# Patient Record
Sex: Female | Born: 1980 | Race: White | Hispanic: No | Marital: Married | Smoking: Former smoker
Health system: Southern US, Community
[De-identification: ages and names within clinical notes are randomized; demographics above are authoritative.]

## PROBLEM LIST (undated history)

## (undated) DIAGNOSIS — K589 Irritable bowel syndrome without diarrhea: Secondary | ICD-10-CM

## (undated) DIAGNOSIS — F419 Anxiety disorder, unspecified: Secondary | ICD-10-CM

## (undated) DIAGNOSIS — T7840XA Allergy, unspecified, initial encounter: Secondary | ICD-10-CM

## (undated) DIAGNOSIS — M25569 Pain in unspecified knee: Secondary | ICD-10-CM

## (undated) DIAGNOSIS — S83509A Sprain of unspecified cruciate ligament of unspecified knee, initial encounter: Secondary | ICD-10-CM

## (undated) DIAGNOSIS — E785 Hyperlipidemia, unspecified: Secondary | ICD-10-CM

## (undated) DIAGNOSIS — M545 Low back pain: Secondary | ICD-10-CM

## (undated) HISTORY — PX: DILATION AND CURETTAGE OF UTERUS: SHX78

## (undated) HISTORY — PX: WISDOM TOOTH EXTRACTION: SHX21

## (undated) HISTORY — DX: Allergy, unspecified, initial encounter: T78.40XA

## (undated) HISTORY — DX: Low back pain: M54.5

## (undated) HISTORY — DX: Sprain of unspecified cruciate ligament of unspecified knee, initial encounter: S83.509A

## (undated) HISTORY — DX: Pain in unspecified knee: M25.569

## (undated) HISTORY — DX: Hyperlipidemia, unspecified: E78.5

## (undated) HISTORY — DX: Anxiety disorder, unspecified: F41.9

## (undated) HISTORY — DX: Irritable bowel syndrome, unspecified: K58.9

---
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
## 1999-04-09 ENCOUNTER — Other Ambulatory Visit: Admission: RE | Admit: 1999-04-09 | Discharge: 1999-04-09 | Payer: Self-pay | Admitting: Obstetrics and Gynecology

## 1999-08-15 ENCOUNTER — Other Ambulatory Visit: Admission: RE | Admit: 1999-08-15 | Discharge: 1999-08-15 | Payer: Self-pay | Admitting: Obstetrics and Gynecology

## 1999-12-17 ENCOUNTER — Other Ambulatory Visit: Admission: RE | Admit: 1999-12-17 | Discharge: 1999-12-17 | Payer: Self-pay | Admitting: *Deleted

## 2000-07-01 ENCOUNTER — Other Ambulatory Visit: Admission: RE | Admit: 2000-07-01 | Discharge: 2000-07-01 | Payer: Self-pay | Admitting: *Deleted

## 2001-07-13 ENCOUNTER — Other Ambulatory Visit: Admission: RE | Admit: 2001-07-13 | Discharge: 2001-07-13 | Payer: Self-pay | Admitting: Obstetrics and Gynecology

## 2002-07-07 ENCOUNTER — Other Ambulatory Visit: Admission: RE | Admit: 2002-07-07 | Discharge: 2002-07-07 | Payer: Self-pay | Admitting: Obstetrics and Gynecology

## 2002-12-07 ENCOUNTER — Other Ambulatory Visit: Admission: RE | Admit: 2002-12-07 | Discharge: 2002-12-07 | Payer: Self-pay | Admitting: Obstetrics and Gynecology

## 2003-05-30 ENCOUNTER — Other Ambulatory Visit: Admission: RE | Admit: 2003-05-30 | Discharge: 2003-05-30 | Payer: Self-pay | Admitting: Obstetrics and Gynecology

## 2004-01-05 ENCOUNTER — Other Ambulatory Visit: Admission: RE | Admit: 2004-01-05 | Discharge: 2004-01-05 | Payer: Self-pay | Admitting: Obstetrics and Gynecology

## 2004-06-22 ENCOUNTER — Observation Stay (HOSPITAL_COMMUNITY): Admission: AC | Admit: 2004-06-22 | Discharge: 2004-06-23 | Payer: Self-pay

## 2005-09-16 ENCOUNTER — Ambulatory Visit: Payer: Self-pay | Admitting: Internal Medicine

## 2005-09-17 ENCOUNTER — Ambulatory Visit: Payer: Self-pay | Admitting: Internal Medicine

## 2005-09-30 ENCOUNTER — Ambulatory Visit: Payer: Self-pay | Admitting: Internal Medicine

## 2005-10-03 ENCOUNTER — Ambulatory Visit: Payer: Self-pay | Admitting: Internal Medicine

## 2005-10-06 ENCOUNTER — Ambulatory Visit: Payer: Self-pay | Admitting: Internal Medicine

## 2005-10-10 ENCOUNTER — Ambulatory Visit: Payer: Self-pay | Admitting: Internal Medicine

## 2005-10-13 ENCOUNTER — Ambulatory Visit: Payer: Self-pay | Admitting: Internal Medicine

## 2005-10-20 ENCOUNTER — Ambulatory Visit: Payer: Self-pay | Admitting: Internal Medicine

## 2005-10-29 ENCOUNTER — Ambulatory Visit: Payer: Self-pay | Admitting: Internal Medicine

## 2005-11-03 ENCOUNTER — Ambulatory Visit: Payer: Self-pay | Admitting: Internal Medicine

## 2005-11-07 ENCOUNTER — Ambulatory Visit: Payer: Self-pay | Admitting: Internal Medicine

## 2005-11-10 ENCOUNTER — Ambulatory Visit: Payer: Self-pay | Admitting: Internal Medicine

## 2005-11-14 ENCOUNTER — Ambulatory Visit: Payer: Self-pay | Admitting: Internal Medicine

## 2005-11-17 ENCOUNTER — Ambulatory Visit: Payer: Self-pay | Admitting: Internal Medicine

## 2005-11-21 ENCOUNTER — Ambulatory Visit: Payer: Self-pay | Admitting: Internal Medicine

## 2005-11-24 ENCOUNTER — Ambulatory Visit: Payer: Self-pay | Admitting: Internal Medicine

## 2005-11-28 ENCOUNTER — Ambulatory Visit: Payer: Self-pay | Admitting: Internal Medicine

## 2005-12-01 ENCOUNTER — Ambulatory Visit: Payer: Self-pay | Admitting: Internal Medicine

## 2005-12-02 ENCOUNTER — Ambulatory Visit: Payer: Self-pay | Admitting: Internal Medicine

## 2005-12-05 ENCOUNTER — Ambulatory Visit: Payer: Self-pay | Admitting: Internal Medicine

## 2005-12-08 ENCOUNTER — Ambulatory Visit: Payer: Self-pay | Admitting: Internal Medicine

## 2005-12-12 ENCOUNTER — Ambulatory Visit: Payer: Self-pay | Admitting: Internal Medicine

## 2005-12-15 ENCOUNTER — Ambulatory Visit: Payer: Self-pay | Admitting: Internal Medicine

## 2005-12-18 ENCOUNTER — Ambulatory Visit: Payer: Self-pay | Admitting: Internal Medicine

## 2005-12-25 ENCOUNTER — Ambulatory Visit: Payer: Self-pay | Admitting: Internal Medicine

## 2005-12-31 ENCOUNTER — Ambulatory Visit: Payer: Self-pay | Admitting: Internal Medicine

## 2006-01-02 ENCOUNTER — Ambulatory Visit: Payer: Self-pay | Admitting: Internal Medicine

## 2006-01-07 ENCOUNTER — Ambulatory Visit: Payer: Self-pay | Admitting: Internal Medicine

## 2006-01-12 ENCOUNTER — Ambulatory Visit: Payer: Self-pay | Admitting: Internal Medicine

## 2006-01-16 ENCOUNTER — Ambulatory Visit: Payer: Self-pay | Admitting: Internal Medicine

## 2006-01-19 ENCOUNTER — Ambulatory Visit: Payer: Self-pay | Admitting: Internal Medicine

## 2006-01-23 ENCOUNTER — Ambulatory Visit: Payer: Self-pay | Admitting: Internal Medicine

## 2006-01-26 ENCOUNTER — Ambulatory Visit: Payer: Self-pay | Admitting: Internal Medicine

## 2006-02-02 ENCOUNTER — Ambulatory Visit: Payer: Self-pay | Admitting: Internal Medicine

## 2006-02-10 ENCOUNTER — Ambulatory Visit: Payer: Self-pay | Admitting: Internal Medicine

## 2006-02-13 ENCOUNTER — Ambulatory Visit: Payer: Self-pay | Admitting: Internal Medicine

## 2009-07-25 ENCOUNTER — Ambulatory Visit: Payer: Self-pay | Admitting: Sports Medicine

## 2009-07-25 DIAGNOSIS — M25569 Pain in unspecified knee: Secondary | ICD-10-CM

## 2009-07-25 DIAGNOSIS — S83509A Sprain of unspecified cruciate ligament of unspecified knee, initial encounter: Secondary | ICD-10-CM

## 2009-07-25 HISTORY — DX: Sprain of unspecified cruciate ligament of unspecified knee, initial encounter: S83.509A

## 2009-07-25 HISTORY — DX: Pain in unspecified knee: M25.569

## 2011-01-31 NOTE — Discharge Summary (Signed)
NAMEFLORIDE, HUTMACHER             ACCOUNT NO.:  [id number]   MEDICAL RECORD NO.:  [id number]          PATIENT TYPE:  INP   LOCATION:  3020                         FACILITY:  [facility]   PHYSICIAN:  Jimmye Norman, M.D.      DATE OF BIRTH:  Mar 22, 1981   DATE OF ADMISSION:  06/22/2004  DATE OF DISCHARGE:  06/23/2004                                 DISCHARGE SUMMARY   CONSULTATIONS:  None.   DISCHARGE DIAGNOSES:  1.  Status post fall from a horse.  2.  Mild concussion.  3.  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  4.  Lumbar strain.  5.  Cervical neck strain.   HISTORY:  This is a healthy 30 year old female who was riding a horse when  the horse jolted and she fell off, landing on the back of her head and  hurled back onto a concrete floor.  She was initially alert but then lost  consciousness and had a post traumatic seizure, reportedly lasting  approximately 45 seconds per bystanders.  She was brought in by EMS awake,  alert and appropriately conversing.  She was complaining of headache and low  back pain.   Work-up at this time including CT scan of the head was negative for acute  intracranial abnormality.  Neck CT scan and cervical spine was without acute  abnormality.  No fractures.  Lumbosacral spine also was negative for  fracture.   The patient was admitted for observation for concussion and post traumatic  seizure.  She had no further seizure activity.  She continues to have  difficulty with headache and neck and low back soreness, but otherwise has  done well.  She is eating.  She is ambulatory.  The patient is deemed ready  for discharge at this time.   DISCHARGE MEDICATIONS:  1.  Vicodin one to two p.o. q.4-6h. p.r.n. pain, #40, no refills.  2.  Flexeril 10 mg one p.o. t.i.d. p.r.n. muscle spasm.  3.  Tylenol or ibuprofen products as needed for milder pain.   ACTIVITY:  To tolerance.  She is to not work or resume driving until cleared  by trauma  service.   FOLLOW UP:  She was cautioned to return for intractable headache, any  recurrent seizure activity or intractable nausea and vomiting.  Follow up in  the trauma clinic on July 02, 2004 or sooner should she have difficulty  in the interim.     SR/MEDQ  D:  06/23/2004  T:  06/23/2004  Job:  [id number]

## 2011-06-03 ENCOUNTER — Ambulatory Visit (INDEPENDENT_AMBULATORY_CARE_PROVIDER_SITE_OTHER): Payer: BC Managed Care – PPO | Admitting: Sports Medicine

## 2011-06-03 ENCOUNTER — Encounter: Payer: Self-pay | Admitting: Sports Medicine

## 2011-06-03 VITALS — BP 119/83 | HR 73 | Ht 63.0 in | Wt 140.0 lb

## 2011-06-03 DIAGNOSIS — M545 Low back pain, unspecified: Secondary | ICD-10-CM

## 2011-06-03 HISTORY — DX: Low back pain, unspecified: M54.50

## 2011-06-03 NOTE — Patient Instructions (Signed)
Stretching Standing against the wall     Knee to chest     Knee to opposite shoulder  Exercises See attached sheet  Continue exercises  Follow up in a few months if not improving

## 2011-06-03 NOTE — Progress Notes (Signed)
  Subjective:    Patient ID: Regina Fernandez, female    DOB: 1981-07-17, 30 y.o.   MRN: [id number]  HPI 30 yo female presents for low back pain/buttock pain. 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 Every AM she has difficulty standing on the right leg alone and has back stiffness in the AM. Rides horses; has pain when bending over to take care of horses feet. There has not been a recent fall off the horse or history of back injury.  She tried Advil for 2-3 weeks back in August which did not help. 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 Patient with increased lumbar lordosis Core stability exercises demonstrated at the office visit and handout given. Piriformis stretches also demonstrated. Supportive footwear while working as well as stretching during breaks

## 2012-01-09 ENCOUNTER — Ambulatory Visit (INDEPENDENT_AMBULATORY_CARE_PROVIDER_SITE_OTHER): Payer: BC Managed Care – PPO | Admitting: Family Medicine

## 2012-01-09 ENCOUNTER — Ambulatory Visit (HOSPITAL_BASED_OUTPATIENT_CLINIC_OR_DEPARTMENT_OTHER)
Admission: RE | Admit: 2012-01-09 | Discharge: 2012-01-09 | Disposition: A | Discharge status: Home / Self Care | Payer: BC Managed Care – PPO | Source: Ambulatory Visit | Attending: Family Medicine | Admitting: Family Medicine

## 2012-01-09 ENCOUNTER — Encounter: Payer: Self-pay | Admitting: Family Medicine

## 2012-01-09 VITALS — BP 129/89 | HR 84 | Temp 98.0°F | Ht 63.0 in | Wt 137.0 lb

## 2012-01-09 DIAGNOSIS — I998 Other disorder of circulatory system: Secondary | ICD-10-CM | POA: Insufficient documentation

## 2012-01-09 DIAGNOSIS — M545 Low back pain: Secondary | ICD-10-CM

## 2012-01-09 DIAGNOSIS — M533 Sacrococcygeal disorders, not elsewhere classified: Secondary | ICD-10-CM

## 2012-01-09 NOTE — Patient Instructions (Signed)
The location of your pain, exam, and history suggests a sacral contusion from repetitive compression on this area while horseback riding. Altering the way you sit on the horse is likely the best way to eliminate this (and/or avoiding it altogether though this is not our goal here). Get the x-rays of your lumbosacral region - we will call you with the results though this is likely to be normal. Ice or heat (whichever feels better) for 15 minutes at a time 3-4 times a day. Donut pad is a consideration if pain at tailbone. Tylenol and/or aleve as needed for pain. Consider formal physical therapy though I'm not sure it will help you much with this condition.

## 2012-01-12 ENCOUNTER — Encounter: Payer: Self-pay | Admitting: Family Medicine

## 2012-01-12 NOTE — Assessment & Plan Note (Signed)
Pain currently seems different than prior presentation to other sports medicine office.  This is midline in sacral/coccygeal area.  X-rays done today negative.  Believe this is due to repetitive bony compression leading to contusion.  Discussed ideal way to help with this would be to sit upright when riding, provide more cushion between her and the horse and possibly a donut type pad.  Ice, tylenol/aleve as needed.  F/u prn.  Do not think PT would help much with this.

## 2012-01-12 NOTE — Progress Notes (Signed)
  Subjective:    Patient ID: Regina Fernandez, female    DOB: 15-Jul-1981, 31 y.o.   MRN: [id number]  PCP: Dr. Juliene Pina  HPI 31 yo F here for low back pain  Patient denies acute injury. She reports having had problems with back in the past - dx with SI dysfunction and accentuated lordosis. States she only intermittently rides horses now - did so 3 times 2 weeks ago and since then has had midline sacral/coccygeal pain without acute injury. No swelling, bruising. No radiation of pain (remotely had problems with pain into thighs). Has tried heating pad, yoga, rest. Not tried any medications. No bowel/bladder dysfunction.  History reviewed. No pertinent past medical history.  No current outpatient prescriptions on file prior to visit.    History reviewed. No pertinent past surgical history.  Allergies  Allergen Reactions  . Miconazole     History   Social History  . Marital Status: Married    Spouse Name: N/A    Number of Children: N/A  . Years of Education: N/A   Occupational History  . Not on file.   Social History Main Topics  . Smoking status: Never Smoker   . Smokeless tobacco: Never Used  . Alcohol Use: Not on file  . Drug Use: Not on file  . Sexually Active: Not on file   Other Topics Concern  . Not on file   Social History Narrative  . No narrative on file    Family History  Problem Relation Age of Onset  . Hyperlipidemia Father   . Sudden death Neg Hx   . Hypertension Neg Hx   . Heart attack Neg Hx   . Diabetes Neg Hx     BP 129/89  Pulse 84  Temp(Src) 98 F (36.7 C) (Oral)  Ht 5\' 3"  (1.6 m)  Wt 137 lb (62.143 kg)  BMI 24.27 kg/m2  LMP 12/26/2011  Review of Systems See HPI above.    Objective:   Physical Exam Gen: NAD  Back: No gross deformity, scoliosis. No focal TTP.  No midline or bony TTP.= - feels deep in midline sacral area per patient. FROM without pain. Strength LEs 5/5 all muscle groups.   2+ MSRs in patellar and achilles  tendons, equal bilaterally. Negative SLRs. Sensation intact to light touch bilaterally. Negative logroll bilateral hips Negative fabers and piriformis stretches.    Assessment & Plan:  1. Low back pain - Pain currently seems different than prior presentation to other sports medicine office.  This is midline in sacral/coccygeal area.  X-rays done today negative.  Believe this is due to repetitive bony compression leading to contusion.  Discussed ideal way to help with this would be to sit upright when riding, provide more cushion between her and the horse and possibly a donut type pad.  Ice, tylenol/aleve as needed.  F/u prn.  Do not think PT would help much with this.

## 2012-01-21 ENCOUNTER — Ambulatory Visit: Payer: BC Managed Care – PPO | Admitting: Sports Medicine

## 2014-05-31 ENCOUNTER — Ambulatory Visit (INDEPENDENT_AMBULATORY_CARE_PROVIDER_SITE_OTHER): Payer: PRIVATE HEALTH INSURANCE | Admitting: Family Medicine

## 2014-05-31 VITALS — BP 120/70 | HR 73 | Temp 98.2°F | Resp 16 | Ht 63.0 in | Wt 121.0 lb

## 2014-05-31 DIAGNOSIS — R599 Enlarged lymph nodes, unspecified: Secondary | ICD-10-CM

## 2014-05-31 NOTE — Progress Notes (Signed)
  Regina Fernandez - 33 y.o. female MRN [id number]  Date of birth: 1980/09/28  SUBJECTIVE:  Including CC & ROS.  patient C/O: left side neck nodule Onset of symptoms: 3 days Symptoms:  Pea size nodule at the base of her skull on the left-side, causing soreness. Has not increased in size, no redness, swelling, or drainage. Reports recent local bug bite in the upper back that cause swelling. She has cats but no scratches, has tattoos but non-recently Relieving factors: nothing topical but tried ibuprofen for pain Worsened by: palpation    ROS:  Constitutional:  No fever, chills, or fatigue.  Respiratory:  No shortness of breath, cough, or wheezing Cardiovascular:  No palpitations, chest pain or syncope Gastrointestinal:  No nausea, no abdominal pain Review of systems otherwise negative except for what is stated in HPI  HISTORY: Past Medical, Surgical, Social, and Family History Reviewed & Updated per EMR. Pertinent Historical Findings include: Nonsmoker, no exposure to cat bite or scratch   PHYSICAL EXAM:  VS: BP:120/70 mmHg  HR:73bpm  TEMP:98.2 F (36.8 C)(Oral)  RESP:100 %  HT:5\' 3"  (160 cm)   WT:121 lb (54.885 kg)  BMI:21.5 UPPER BACK EXAM: General: well nourished Skin of UE: warm; dry, no rashes,no ecchymosis or erythema. 0.5cm moderately hard movable swell circumscribed swollen posterior lymph-node.  Vascular: radial pulses 2+ bilaterally Palpation: No step off defects throughout the cervical spine.  No significant paraspinal muscle tenderness. Range of motion: Normal shoulder range of motion.  Normal range of motion in flexion, extension, rotation of the neck. Motor and sensory intact  ASSESSMENT & PLAN:  Acute single swollen posterior cervical lymph node Counsel patient and re-assured her this is likely an localized acute reactive lymphadenopathy from her recent bug bits. Recommend watchful waiting for 2 weeks, and given precaution for concerning signs to f/u sooner.

## 2014-06-02 NOTE — Progress Notes (Signed)
Patient discussed and examined with Dr. Ollen Barges. Agree with assessment and plan of care per her note.

## 2014-10-11 ENCOUNTER — Ambulatory Visit (INDEPENDENT_AMBULATORY_CARE_PROVIDER_SITE_OTHER): Payer: PRIVATE HEALTH INSURANCE

## 2014-10-11 ENCOUNTER — Ambulatory Visit (INDEPENDENT_AMBULATORY_CARE_PROVIDER_SITE_OTHER): Payer: PRIVATE HEALTH INSURANCE | Admitting: Emergency Medicine

## 2014-10-11 VITALS — BP 106/62 | HR 92 | Temp 99.2°F | Resp 16 | Ht 64.5 in | Wt 131.0 lb

## 2014-10-11 DIAGNOSIS — R590 Localized enlarged lymph nodes: Secondary | ICD-10-CM

## 2014-10-11 DIAGNOSIS — D72829 Elevated white blood cell count, unspecified: Secondary | ICD-10-CM

## 2014-10-11 LAB — POCT CBC
Granulocyte percent: 82.8 %G — AB (ref 37–80)
HCT, POC: 40.3 % (ref 37.7–47.9)
Hemoglobin: 13 g/dL (ref 12.2–16.2)
Lymph, poc: 2.5 (ref 0.6–3.4)
MCH, POC: 29.2 pg (ref 27–31.2)
MCHC: 32.3 g/dL (ref 31.8–35.4)
MCV: 90.6 fL (ref 80–97)
MID (cbc): 0.7 (ref 0–0.9)
MPV: 7 fL (ref 0–99.8)
POC Granulocyte: 15.5 — AB (ref 2–6.9)
POC LYMPH PERCENT: 13.3 %L (ref 10–50)
POC MID %: 3.9 %M (ref 0–12)
Platelet Count, POC: 336 10*3/uL (ref 142–424)
RBC: 4.45 M/uL (ref 4.04–5.48)
RDW, POC: 14.5 %
WBC: 18.7 10*3/uL — AB (ref 4.6–10.2)

## 2014-10-11 LAB — COMPREHENSIVE METABOLIC PANEL
ALT: 14 U/L (ref 0–35)
AST: 16 U/L (ref 0–37)
Albumin: 3.8 g/dL (ref 3.5–5.2)
Alkaline Phosphatase: 54 U/L (ref 39–117)
BUN: 12 mg/dL (ref 6–23)
CO2: 26 mEq/L (ref 19–32)
Calcium: 8.8 mg/dL (ref 8.4–10.5)
Chloride: 106 mEq/L (ref 96–112)
Creat: 0.69 mg/dL (ref 0.50–1.10)
Glucose, Bld: 80 mg/dL (ref 70–99)
Potassium: 3.9 mEq/L (ref 3.5–5.3)
Sodium: 140 mEq/L (ref 135–145)
Total Bilirubin: 0.5 mg/dL (ref 0.2–1.2)
Total Protein: 6.5 g/dL (ref 6.0–8.3)

## 2014-10-11 LAB — POCT URINE PREGNANCY: Preg Test, Ur: NEGATIVE

## 2014-10-11 LAB — POCT RAPID STREP A (OFFICE): Rapid Strep A Screen: NEGATIVE

## 2014-10-11 MED ORDER — AMOXICILLIN-POT CLAVULANATE 875-125 MG PO TABS: 1.0000 | ORAL_TABLET | Freq: Two times a day (BID) | ORAL | Status: AC

## 2014-10-11 NOTE — Progress Notes (Signed)
Subjective:    Patient ID: Regina Fernandez, female    DOB: August 27, 1981, 34 y.o.   MRN: [id number]   PCP: Elveria Royals, MD  Chief Complaint  Patient presents with  . Adenopathy    x 2 days     Allergies  Allergen Reactions  . Miconazole     There are no active problems to display for this patient.   Prior to Admission medications   Medication Sig Start Date End Date Taking? Authorizing Provider  Drospiren-Eth Estrad-Levomefol (BEYAZ PO) Take by mouth daily.   Yes Historical Provider, MD  5-Hydroxytryptophan (5-HTP) 50 MG CAPS Take by mouth.    Historical Provider, MD  calcium elemental as carbonate (BARIATRIC TUMS ULTRA) 400 MG tablet Chew 1,000 mg by mouth.    Historical Provider, MD  Cholecalciferol (VITAMIN D3) 2000 UNITS capsule Take by mouth.    Historical Provider, MD  Melatonin 5 MG TABS Take by mouth.    Historical Provider, MD  Multiple Vitamin (THERA) TABS Take 1 tablet by mouth.    Historical Provider, MD    Medical, Surgical, Family and Social History reviewed and updated.  HPI  Presents accompanied by her boyfriend, for evaluation of swelling of nodes in her neck.  Awoke with swelling under the jaw yesterday morning. Has worsened-become more swollen. They note that the angle of her jaw has "disappeared." Advil helps pain and reduces swelling some. Now has a swollen feeling in the throat, not sore. Unable to fully open her mouth because it feels stiff.  This morning had trouble eating due to pain and pressure and swelling. Sitting up feels better than lying down. No worsening of the swelling with eating, salivating. Has not ever had this before. She did have a reactive lymph node last fall after an insect bite.  "It's Mono or Mumps or Strep."  Low grade fever last night. Teeth start to hurt when Advil wears off. Tonsils looked swollen and kind of junky this morning. No nausea/vomiting, diarrhea. No unexplained muscle or joint pain. Doesn't feel good, but  non-specific.  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 Seen at [facility] yesterday and was advised to suck on lemon drops.  Review of Systems As above.    Objective:   Physical Exam  Constitutional: She is oriented to person, place, and time. Vital signs are normal. She appears well-developed and well-nourished. She is active and cooperative. No distress.  BP 106/62 mmHg  Pulse 92  Temp(Src) 99.2 F (37.3 C) (Oral)  Resp 16  Ht 5' 4.5" (1.638 m)  Wt 131 lb (59.421 kg)  BMI 22.15 kg/m2  SpO2 100%  LMP 09/20/2014 (Exact Date)  HENT:  Head: Normocephalic and atraumatic.  Right Ear: Hearing, tympanic membrane, external ear and ear canal normal.  Left Ear: Hearing, tympanic membrane, external ear and ear canal normal.  Nose: Nose normal.  Mouth/Throat: Uvula is midline and oropharynx is clear and moist. No oropharyngeal exudate.  She has difficulty opening the mouth wide, reporting that it feels "stiff." Salivary duct openings are not irritated, erythematous or swollen, and are non-tender.  Eyes: Conjunctivae and EOM are normal. Pupils are equal, round, and reactive to light. Right eye exhibits no discharge. Left eye exhibits no discharge. No scleral icterus.  Neck: Trachea normal and normal range of motion. Neck supple. No spinous process tenderness and no muscular tenderness present. No thyromegaly present.  Cardiovascular: Normal rate, regular rhythm, normal  heart sounds and normal pulses.   Pulses:      Radial pulses are 2+ on the right side, and 2+ on the left side.  Pulmonary/Chest: Effort normal and breath sounds normal.  Lymphadenopathy:       Head (right side): Submandibular adenopathy present. No submental, no tonsillar, no preauricular, no posterior auricular and no occipital adenopathy present.       Head (left side): Submandibular adenopathy present. No submental, no tonsillar, no preauricular, no  posterior auricular and no occipital adenopathy present.    She has cervical adenopathy.       Right cervical: Posterior cervical (1 very small node, mid-chain ) adenopathy present. No superficial cervical and no deep cervical adenopathy present.      Left cervical: No superficial cervical, no deep cervical and no posterior cervical adenopathy present.    She has no axillary adenopathy.       Right: No supraclavicular and no epitrochlear adenopathy present.       Left: No supraclavicular and no epitrochlear adenopathy present.  Submandibular nodes are very firm and very symmetric. Tender.  Neurological: She is alert and oriented to person, place, and time. No sensory deficit.  Skin: Skin is warm, dry and intact. No rash noted. No cyanosis or erythema. Nails show no clubbing.  Psychiatric: She has a normal mood and affect. Her speech is normal and behavior is normal.     Results for orders placed or performed in visit on 10/11/14  POCT CBC  Result Value Ref Range   WBC 18.7 (A) 4.6 - 10.2 K/uL   Lymph, poc 2.5 0.6 - 3.4   POC LYMPH PERCENT 13.3 10 - 50 %L   MID (cbc) 0.7 0 - 0.9   POC MID % 3.9 0 - 12 %M   POC Granulocyte 15.5 (A) 2 - 6.9   Granulocyte percent 82.8 (A) 37 - 80 %G   RBC 4.45 4.04 - 5.48 M/uL   Hemoglobin 13.0 12.2 - 16.2 g/dL   HCT, POC 40.3 37.7 - 47.9 %   MCV 90.6 80 - 97 fL   MCH, POC 29.2 27 - 31.2 pg   MCHC 32.3 31.8 - 35.4 g/dL   RDW, POC 14.5 %   Platelet Count, POC 336 142 - 424 K/uL   MPV 7.0 0 - 99.8 fL  POCT rapid strep A  Result Value Ref Range   Rapid Strep A Screen Negative Negative  POCT urine pregnancy  Result Value Ref Range   Preg Test, Ur Negative    CXR: [facility] reading (PRIMARY) by  Dr. Everlene Farrier. No lymphadenopathy of the chest. Increased markings bilaterally are non-specific and may represent breast tissue attenuation.       Assessment & Plan:  1. Lymphadenopathy, submandibular Await remaining labs. Cover for bacterial process with  Augmentin. RTC in 48 hours for re-evaluation and repeat CBC, sooner if symptoms worsen. Supportive care in the meantime. - POCT CBC - Pathologist smear review - POCT rapid strep A - Comprehensive metabolic panel - POCT urine pregnancy - DG Chest 2 View; Future - Culture, Group A Strep - amoxicillin-clavulanate (AUGMENTIN) 875-125 MG per tablet; Take 1 tablet by mouth 2 (two) times daily.  Dispense: 20 tablet; Refill: 0  2. Leucocytosis See above. Doubt mono. CXR is reassuring.  - Pathologist smear review   Fara Chute, PA-C Physician Assistant-Certified [facility]

## 2014-10-11 NOTE — Patient Instructions (Signed)
Continue the Advil. You may take up to 800 mg three times daily with food. You may supplement that with acetaminophen (Tylenol) if needed. DRINK lots of fluids. Eat what tastes good.

## 2014-10-12 LAB — PATHOLOGIST SMEAR REVIEW

## 2014-10-13 ENCOUNTER — Ambulatory Visit (INDEPENDENT_AMBULATORY_CARE_PROVIDER_SITE_OTHER): Payer: PRIVATE HEALTH INSURANCE | Admitting: Emergency Medicine

## 2014-10-13 VITALS — BP 122/82 | HR 77 | Temp 98.5°F | Resp 16 | Ht 64.0 in | Wt 131.0 lb

## 2014-10-13 DIAGNOSIS — R682 Dry mouth, unspecified: Secondary | ICD-10-CM

## 2014-10-13 DIAGNOSIS — R591 Generalized enlarged lymph nodes: Secondary | ICD-10-CM

## 2014-10-13 DIAGNOSIS — R51 Headache: Secondary | ICD-10-CM

## 2014-10-13 DIAGNOSIS — R519 Headache, unspecified: Secondary | ICD-10-CM

## 2014-10-13 LAB — POCT CBC
Granulocyte percent: 75.4 %G (ref 37–80)
HCT, POC: 38.8 % (ref 37.7–47.9)
Hemoglobin: 12.5 g/dL (ref 12.2–16.2)
Lymph, poc: 2.4 (ref 0.6–3.4)
MCH, POC: 28.7 pg (ref 27–31.2)
MCHC: 32.3 g/dL (ref 31.8–35.4)
MCV: 89 fL (ref 80–97)
MID (cbc): 0.6 (ref 0–0.9)
MPV: 7.2 fL (ref 0–99.8)
POC Granulocyte: 9.3 — AB (ref 2–6.9)
POC LYMPH PERCENT: 19.9 %L (ref 10–50)
POC MID %: 4.7 %M (ref 0–12)
Platelet Count, POC: 363 10*3/uL (ref 142–424)
RBC: 4.36 M/uL (ref 4.04–5.48)
RDW, POC: 13.3 %
WBC: 12.3 10*3/uL — AB (ref 4.6–10.2)

## 2014-10-13 LAB — CULTURE, GROUP A STREP: Organism ID, Bacteria: NORMAL

## 2014-10-13 LAB — POCT SEDIMENTATION RATE: POCT SED RATE: 50 mm/hr — AB (ref 0–22)

## 2014-10-13 NOTE — Progress Notes (Addendum)
Subjective:   This chart was scribed for Regina Queen MD, by Tamsen Roers, at [facility].  This patient was seen in room 3 and the patient's care was started at 1:13 PM.      Patient ID: Regina Fernandez, female    DOB: [birth year], 34 y.o.   MRN: [id number]  HPI  HPI Comments: Regina Fernandez is a 34 y.o. female who presents to [facility] for a follow-up.   Patient notes that everyday is different.  States her salivary glands are swollen and her mouth has been very dry. Complains of puffiness in her face.  Patient is alternating Tylenol and Advil every 4 hours and states that if she doesn't, she is hurting and has trouble chewing.  Patient is a Marine scientist.      There are no active problems to display for this patient.  Past Medical History  Diagnosis Date  . Allergy   . KNEE PAIN, LEFT 07/25/2009    Qualifier: Diagnosis of  By: Oneida Alar MD, KARL    . Low back pain 06/03/2011  . SPRAIN AND STRAIN OF CRUCIATE LIGAMENT OF KNEE 07/25/2009    Qualifier: Diagnosis of  By: Oneida Alar MD, [place]     Past Surgical History  Procedure Laterality Date  . Fracture surgery Left 1990    elbow   Allergies  Allergen Reactions  . Miconazole    Prior to Admission medications   Medication Sig Start Date End Date Taking? Authorizing Provider  5-Hydroxytryptophan (5-HTP) 50 MG CAPS Take by mouth.   Yes Historical Provider, MD  acetaminophen (TYLENOL) 325 MG suppository Place 325 mg rectally every 4 (four) hours as needed.   Yes Historical Provider, MD  amoxicillin-clavulanate (AUGMENTIN) 875-125 MG per tablet Take 1 tablet by mouth 2 (two) times daily. 10/11/14 10/21/14 Yes Chelle S Jeffery, PA-C  calcium elemental as carbonate (BARIATRIC TUMS ULTRA) 400 MG tablet Chew 1,000 mg by mouth.   Yes Historical Provider, MD  Cholecalciferol (VITAMIN D3) 2000 UNITS capsule Take by mouth.   Yes Historical Provider, MD  Drospiren-Eth Estrad-Levomefol (BEYAZ PO) Take by  mouth daily.   Yes Historical Provider, MD  ibuprofen (ADVIL,MOTRIN) 800 MG tablet Take 800 mg by mouth every 8 (eight) hours as needed.   Yes Historical Provider, MD  Melatonin 5 MG TABS Take by mouth.   Yes Historical Provider, MD  Multiple Vitamin (THERA) TABS Take 1 tablet by mouth.   Yes Historical Provider, MD   History   Social History  . Marital Status: Single    Spouse Name: boyfriend-TD    Number of Children: 0  . Years of Education: N/A   Occupational History  . RN     General Medicine at [employer]   Social History Main Topics  . Smoking status: Former Research scientist (life sciences)  . Smokeless tobacco: Never Used  . Alcohol Use: No  . Drug Use: No  . Sexual Activity:    Partners: Male   Other Topics Concern  . Not on file   Social History Narrative   Lives with her parents. Completed nursing school in 2014.        Review of Systems  Constitutional: Negative for fever and chills.       Objective:   Physical Exam CONSTITUTIONAL: Well developed/well nourished HEAD: Normocephalic/atraumatic EYES: EOMI/PERRL ENMT: Mucous membranes moist NECK: supple no meningeal signs SPINE/BACK:entire spine nontender CV: S1/S2 noted, no murmurs/rubs/gallops noted LUNGS: Lungs are clear to auscultation bilaterally, no apparent  distress ABDOMEN: soft, nontender, no rebound or guarding, bowel sounds noted throughout abdomen GU:no cva tenderness NEURO: Pt is awake/alert/appropriate, moves all extremitiesx4.  No facial droop.   EXTREMITIES: pulses normal/equal, full ROM SKIN: warm, color normal PSYCH: no abnormalities of mood noted, alert and oriented to situation She is alert and cooperative.  No parotid gland enlargement.  She has bilateral enlargement of the submandibular glands which are firm and tender to touch.  She is not sick looking.     Filed Vitals:   10/13/14 1310  BP: 122/82  Pulse: 77  Temp: 98.5 F (36.9 C)  TempSrc: Oral  Resp: 16  Height: 5\' 4"  (1.626 m)  Weight: 131 lb  (59.421 kg)  SpO2: 100%   Results for orders placed or performed in visit on 10/13/14  POCT CBC  Result Value Ref Range   WBC 12.3 (A) 4.6 - 10.2 K/uL   Lymph, poc 2.4 0.6 - 3.4   POC LYMPH PERCENT 19.9 10 - 50 %L   MID (cbc) 0.6 0 - 0.9   POC MID % 4.7 0 - 12 %M   POC Granulocyte 9.3 (A) 2 - 6.9   Granulocyte percent 75.4 37 - 80 %G   RBC 4.36 4.04 - 5.48 M/uL   Hemoglobin 12.5 12.2 - 16.2 g/dL   HCT, POC 38.8 37.7 - 47.9 %   MCV 89.0 80 - 97 fL   MCH, POC 28.7 27 - 31.2 pg   MCHC 32.3 31.8 - 35.4 g/dL   RDW, POC 13.3 %   Platelet Count, POC 363 142 - 424 K/uL   MPV 7.2 0 - 99.8 fL         Assessment & Plan:  Patient's white count is improving. She still has adenopathy versus swelling in the submaxillary glands and swelling in the parotid area. White count does not look like a viral infection. I did check an IgM and IgG for mumps. Sjogren's antibodies will be attempted to be ordered. I do think it would be good to continue the Augmentin routine lab work was done I personally performed the services described in this documentation, which was scribed in my presence. The recorded information has been reviewed and is accurate.

## 2014-10-16 LAB — SJOGREN'S SYNDROME ANTIBODS(SSA + SSB)
SSA (Ro) (ENA) Antibody, IgG: <1
SSB (La) (ENA) Antibody, IgG: <1

## 2014-10-16 LAB — MUMPS ANTIBODY, IGG: Mumps IgG: 8.45 AU/mL (ref <9.00)

## 2014-10-18 LAB — MUMPS ANTIBODY, IGM: Mumps IgM Value: <1:20 {titer}

## 2014-11-01 ENCOUNTER — Ambulatory Visit (INDEPENDENT_AMBULATORY_CARE_PROVIDER_SITE_OTHER): Payer: PRIVATE HEALTH INSURANCE | Admitting: Emergency Medicine

## 2014-11-01 VITALS — BP 128/80 | HR 74 | Temp 98.2°F | Resp 17 | Ht 65.5 in | Wt 131.0 lb

## 2014-11-01 DIAGNOSIS — D72829 Elevated white blood cell count, unspecified: Secondary | ICD-10-CM

## 2014-11-01 DIAGNOSIS — R591 Generalized enlarged lymph nodes: Secondary | ICD-10-CM

## 2014-11-01 NOTE — Progress Notes (Signed)
   Subjective:    Patient ID: Regina Fernandez, female    DOB: 1981-08-25, 34 y.o.   MRN: [id number]  This chart was scribed for Regina Russian, MD by Stephania Fragmin, ED Scribe. This patient was seen in room 8 and the patient's care was started at 4:33 PM.   HPI  HPI Comments: [name] is a 34 y.o. female who presents to the [facility] for a follow-up after being seen by me for lymphadenopathy. Her white count at the time was 12,300 and treated with antibiotics (Augmentin). She reports reduced neck swelling.  Review of Systems  Constitutional: Negative for fatigue and unexpected weight change.  Respiratory: Negative for chest tightness and shortness of breath.   Cardiovascular: Negative for chest pain, palpitations and leg swelling.  Gastrointestinal: Negative for abdominal pain and blood in stool.  Neurological: Negative for dizziness, syncope, light-headedness and headaches.       Objective:   Physical Exam  Nursing note and vitals reviewed.  CONSTITUTIONAL: Well developed/well nourished HEAD: Normocephalic/atraumatic EYES: EOMI/PERRL ENMT: Mucous membranes moist NECK: supple no meningeal signs the previously noted adenopathy in the neck is essentially 85% gone. SPINE/BACK:entire spine nontender CV: S1/S2 noted, no murmurs/rubs/gallops noted LUNGS: Lungs are clear to auscultation bilaterally, no apparent distress ABDOMEN: soft, nontender, no rebound or guarding, bowel sounds noted throughout abdomen. No spleen palpable GU:no cva tenderness NEURO: Pt is awake/alert/appropriate, moves all extremitiesx4.  No facial droop.   EXTREMITIES: pulses normal/equal, full ROM SKIN: warm, color normal PSYCH: no abnormalities of mood noted, alert and oriented to situation       Assessment & Plan:   patient looks great no further testing indicated. Her testing for Sjogren's and mumps were negative. I did not feel repeat white count was indicated.I personally  performed the services described in this documentation, which was scribed in my presence. The recorded information has been reviewed and is accurate.

## 2015-09-18 MED FILL — LO LOESTRIN FE 1-10 TABLET: 1 MG-10 MCG | 28 days supply | Qty: 28 | Fill #2

## 2015-10-17 MED FILL — LO LOESTRIN FE 1-10 TABLET: 1 MG-10 MCG | 84 days supply | Qty: 84 | Fill #3

## 2016-01-14 MED FILL — LO LOESTRIN FE 1-10 TABLET: 1 MG-10 MCG | 84 days supply | Qty: 84 | Fill #4

## 2016-04-04 MED FILL — LO LOESTRIN FE 1-10 TABLET: 1 MG-10 MCG | 84 days supply | Qty: 84 | Fill #5

## 2016-06-19 MED FILL — LO LOESTRIN FE 1-10 TABLET: 1 MG-10 MCG | 28 days supply | Qty: 28 | Fill #6

## 2016-07-09 DIAGNOSIS — Z01419 Encounter for gynecological examination (general) (routine) without abnormal findings: Secondary | ICD-10-CM | POA: Diagnosis not present

## 2016-07-15 DIAGNOSIS — Z130 Encounter for screening for diseases of the blood and blood-forming organs and certain disorders involving the immune mechanism: Secondary | ICD-10-CM | POA: Diagnosis not present

## 2016-07-15 DIAGNOSIS — Z13220 Encounter for screening for lipoid disorders: Secondary | ICD-10-CM | POA: Diagnosis not present

## 2016-07-15 DIAGNOSIS — Z0000 Encounter for general adult medical examination without abnormal findings: Secondary | ICD-10-CM | POA: Diagnosis not present

## 2016-07-28 MED FILL — LEVONOR-ETH ESTRAD 0.1-0.02: 0.1-20 | 28 days supply | Qty: 28 | Fill #0

## 2016-08-19 MED FILL — LEVONOR-ETH ESTRAD 0.1-0.02: 0.1-20 | 84 days supply | Qty: 84 | Fill #1

## 2016-10-06 DIAGNOSIS — L821 Other seborrheic keratosis: Secondary | ICD-10-CM | POA: Diagnosis not present

## 2016-10-06 DIAGNOSIS — Z23 Encounter for immunization: Secondary | ICD-10-CM | POA: Diagnosis not present

## 2016-10-06 DIAGNOSIS — D1801 Hemangioma of skin and subcutaneous tissue: Secondary | ICD-10-CM | POA: Diagnosis not present

## 2016-10-06 DIAGNOSIS — D225 Melanocytic nevi of trunk: Secondary | ICD-10-CM | POA: Diagnosis not present

## 2016-10-06 DIAGNOSIS — L309 Dermatitis, unspecified: Secondary | ICD-10-CM | POA: Diagnosis not present

## 2016-10-06 DIAGNOSIS — L814 Other melanin hyperpigmentation: Secondary | ICD-10-CM | POA: Diagnosis not present

## 2016-11-05 MED FILL — LEVONOR-ETH ESTRAD 0.1-0.02: 0.1-20 | 84 days supply | Qty: 84 | Fill #2

## 2017-01-29 MED FILL — LEVONOR-ETH ESTRAD 0.1-0.02: 0.1-20 | 84 days supply | Qty: 84 | Fill #3

## 2017-04-17 MED FILL — LEVONOR-ETH ESTRAD 0.1-0.02: 0.1-20 | 84 days supply | Qty: 84 | Fill #4

## 2017-07-10 DIAGNOSIS — Z01419 Encounter for gynecological examination (general) (routine) without abnormal findings: Secondary | ICD-10-CM | POA: Diagnosis not present

## 2017-07-10 DIAGNOSIS — Z6822 Body mass index (BMI) 22.0-22.9, adult: Secondary | ICD-10-CM | POA: Diagnosis not present

## 2017-07-10 DIAGNOSIS — Z1151 Encounter for screening for human papillomavirus (HPV): Secondary | ICD-10-CM | POA: Diagnosis not present

## 2017-07-10 MED FILL — LEVONOR-ETH ESTRAD 0.1-0.02: 0.1-20 | 84 days supply | Qty: 84 | Fill #0

## 2017-08-20 DIAGNOSIS — Z30430 Encounter for insertion of intrauterine contraceptive device: Secondary | ICD-10-CM | POA: Diagnosis not present

## 2017-08-20 DIAGNOSIS — Z3202 Encounter for pregnancy test, result negative: Secondary | ICD-10-CM | POA: Diagnosis not present

## 2017-10-29 DIAGNOSIS — H5213 Myopia, bilateral: Secondary | ICD-10-CM | POA: Diagnosis not present

## 2018-07-16 DIAGNOSIS — Z01419 Encounter for gynecological examination (general) (routine) without abnormal findings: Secondary | ICD-10-CM | POA: Diagnosis not present

## 2018-07-16 DIAGNOSIS — Z30431 Encounter for routine checking of intrauterine contraceptive device: Secondary | ICD-10-CM | POA: Diagnosis not present

## 2019-04-08 DIAGNOSIS — H5213 Myopia, bilateral: Secondary | ICD-10-CM | POA: Diagnosis not present

## 2019-08-22 DIAGNOSIS — Z01419 Encounter for gynecological examination (general) (routine) without abnormal findings: Secondary | ICD-10-CM | POA: Diagnosis not present

## 2019-08-22 DIAGNOSIS — Z1151 Encounter for screening for human papillomavirus (HPV): Secondary | ICD-10-CM | POA: Diagnosis not present

## 2019-08-22 DIAGNOSIS — Z6823 Body mass index (BMI) 23.0-23.9, adult: Secondary | ICD-10-CM | POA: Diagnosis not present

## 2019-10-06 DIAGNOSIS — Z1231 Encounter for screening mammogram for malignant neoplasm of breast: Secondary | ICD-10-CM | POA: Diagnosis not present

## 2021-01-03 ENCOUNTER — Encounter (HOSPITAL_COMMUNITY): Payer: Self-pay

## 2021-01-03 ENCOUNTER — Emergency Department (HOSPITAL_COMMUNITY)
Admission: EM | Admit: 2021-01-03 | Discharge: 2021-01-03 | Disposition: A | Discharge status: Home / Self Care | Payer: 59 | Attending: Emergency Medicine | Admitting: Emergency Medicine

## 2021-01-03 ENCOUNTER — Emergency Department (HOSPITAL_COMMUNITY): Payer: 59

## 2021-01-03 ENCOUNTER — Ambulatory Visit (HOSPITAL_COMMUNITY)
Admission: RE | Admit: 2021-01-03 | Discharge: 2021-01-03 | Disposition: A | Discharge status: Home / Self Care | Payer: 59 | Source: Ambulatory Visit | Attending: Obstetrics & Gynecology | Admitting: Obstetrics & Gynecology

## 2021-01-03 ENCOUNTER — Other Ambulatory Visit: Payer: Self-pay

## 2021-01-03 VITALS — BP 116/78 | HR 73 | Temp 99.3°F | Resp 18

## 2021-01-03 DIAGNOSIS — R1012 Left upper quadrant pain: Secondary | ICD-10-CM | POA: Diagnosis not present

## 2021-01-03 DIAGNOSIS — S299XXA Unspecified injury of thorax, initial encounter: Secondary | ICD-10-CM

## 2021-01-03 DIAGNOSIS — S7002XA Contusion of left hip, initial encounter: Secondary | ICD-10-CM | POA: Diagnosis not present

## 2021-01-03 DIAGNOSIS — Z87891 Personal history of nicotine dependence: Secondary | ICD-10-CM | POA: Insufficient documentation

## 2021-01-03 DIAGNOSIS — M25552 Pain in left hip: Secondary | ICD-10-CM | POA: Insufficient documentation

## 2021-01-03 DIAGNOSIS — S2232XA Fracture of one rib, left side, initial encounter for closed fracture: Secondary | ICD-10-CM | POA: Diagnosis not present

## 2021-01-03 NOTE — ED Triage Notes (Signed)
Pt presents to the ED from [facility] after getting bucked off of a horse on Sunday. C/o left flank pain and left rib pain. No bruising assessed.

## 2021-01-03 NOTE — ED Provider Notes (Signed)
[facility] EMERGENCY DEPARTMENT Provider Note   CSN: [id number] Arrival date & time: 01/03/21  1453     History Chief Complaint  Patient presents with  . Fall    Regina Fernandez is a 40 y.o. female.  Patient presents to the emergency department for evaluation of left lower rib pain starting 4 days ago after she fell from a horse.  Patient went to urgent care prior to arrival and was sent to the ED due to concern for intra-abdominal injury.  Patient has had improving pain over the past 4 days but continues to have pain that is focused in the left inferior anterior ribs with radiation to the left flank and mid back.  She also reports having pain in her left hip at the time of injury, however this is greatly improved.  She is able to move well.  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  She has been using an abdominal binder to help keep the area stable.  She has been taking ibuprofen and Flexeril.  She denies hematuria, difficulty breathing, cough or fever.  No bowel changes.  Onset of symptoms acute.  Movement makes the pain worse.        Past Medical History:  Diagnosis Date  . Allergy   . KNEE PAIN, LEFT 07/25/2009   Qualifier: Diagnosis of  By: Oneida Alar MD, KARL    . Low back pain 06/03/2011  . SPRAIN AND STRAIN OF CRUCIATE LIGAMENT OF KNEE 07/25/2009   Qualifier: Diagnosis of  By: FIELDS MD, KARL      There are no problems to display for this patient.   Past Surgical History:  Procedure Laterality Date  . FRACTURE SURGERY Left 1990   elbow     OB History   No obstetric history on file.     Family History  Problem Relation Age of Onset  . Hyperlipidemia Father   . Sudden death Neg Hx   . Hypertension Neg Hx   . Heart attack Neg Hx   . Diabetes Neg Hx     Social History   Tobacco Use  . Smoking status: Former Research scientist (life sciences)  . Smokeless tobacco: Never Used  Vaping Use  . Vaping Use: Never used  Substance Use Topics  .  Alcohol use: Yes    Alcohol/week: 0.0 standard drinks    Comment: rare  . Drug use: No    Home Medications Prior to Admission medications   Medication Sig Start Date End Date Taking? Authorizing Provider  5-Hydroxytryptophan (5-HTP) 50 MG CAPS Take by mouth.    [provider]  acetaminophen (TYLENOL) 325 MG suppository Place 325 mg rectally every 4 (four) hours as needed.    [provider]  calcium elemental as carbonate (BARIATRIC TUMS ULTRA) 400 MG tablet Chew 1,000 mg by mouth.    [provider]  Cholecalciferol (VITAMIN D3) 2000 UNITS capsule Take by mouth.    [provider]  Drospiren-Eth Estrad-Levomefol (BEYAZ PO) Take by mouth daily.    [provider]  ibuprofen (ADVIL,MOTRIN) 800 MG tablet Take 800 mg by mouth every 8 (eight) hours as needed.    [provider]  Melatonin 5 MG TABS Take by mouth.    [provider]  Multiple Vitamin (THERA) TABS Take 1 tablet by mouth.    [provider]    Allergies    Miconazole  Review of Systems   Review of Systems  Constitutional: Negative for  fever.  Respiratory: Negative for shortness of breath.   Cardiovascular: Positive for chest pain (ribs only).  Gastrointestinal: Positive for abdominal pain. Negative for nausea and vomiting.  Genitourinary: Positive for flank pain. Negative for hematuria.    Physical Exam Updated Vital Signs BP (!) 129/100 (BP Location: Right Arm)   Pulse (!) 101   Temp 98.3 F (36.8 C) (Oral)   Resp 15   SpO2 98%   Physical Exam Vitals and nursing note reviewed.  Constitutional:      Appearance: She is well-developed. She is not diaphoretic.  HENT:     Head: Normocephalic and atraumatic.     Mouth/Throat:     Mouth: Mucous membranes are not dry.  Eyes:     Conjunctiva/sclera: Conjunctivae normal.  Neck:     Trachea: Trachea normal. No tracheal deviation.  Cardiovascular:     Rate and Rhythm: Normal rate and regular  rhythm.     Pulses: No decreased pulses.     Heart sounds: Normal heart sounds, S1 normal and S2 normal. No murmur heard.   Pulmonary:     Effort: Pulmonary effort is normal. No respiratory distress.     Breath sounds: No wheezing.  Chest:     Chest wall: No tenderness.  Abdominal:     General: Bowel sounds are normal.     Palpations: Abdomen is soft.     Tenderness: There is no abdominal tenderness. There is no guarding or rebound.     Comments: On my exam, patient is able to move around on the stretcher without any difficulties including guarding.  No rebound or tenderness with palpation in the left upper quadrant.  Patient does have tenderness over the anterolateral inferior ribs.  Musculoskeletal:        General: Normal range of motion.     Cervical back: Normal range of motion and neck supple. No muscular tenderness.  Skin:    General: Skin is warm and dry.     Coloration: Skin is not pale.  Neurological:     Mental Status: She is alert.     ED Results / Procedures / Treatments   Labs (all labs ordered are listed, but only abnormal results are displayed) Labs Reviewed - No data to display  EKG None  Radiology DG Ribs Unilateral W/Chest Left  Result Date: 01/03/2021 CLINICAL DATA:  Fall from horse, chest wall pain, left flank pain, rib pain EXAM: LEFT RIBS AND CHEST - 3+ VIEW COMPARISON:  None. FINDINGS: Bibasilar atelectasis. No visible effusion or pneumothorax. Heart is normal size. Questionable anterolateral left 10th rib fracture, nondisplaced. IMPRESSION: Questionable nondisplaced left anterolateral 10th rib fracture. Bibasilar atelectasis. Electronically Signed   By: Rolm Baptise M.D.   On: 01/03/2021 15:39    Procedures Procedures   Medications Ordered in ED Medications - No data to display  ED Course  I have reviewed the triage vital signs and the nursing notes.  Pertinent labs & imaging results that were available during my care of the patient were  reviewed by me and considered in my medical decision making (see chart for details).  Patient seen and examined.  X-ray ordered in triage demonstrates questionable nondisplaced rib fracture.  This is consistent with the patient's injury pattern.  I reviewed urgent care visit.  They were concerned about intra-abdominal injury.  Patient does not have any rebound or guarding, ecchymosis, tenderness on my exam to suggest that she has a significant intra-abdominal injury.  I do not feel that she requires  a CT of her abdomen pelvis today.  I discussed this with the patient and she is in agreement.  She is mainly concerned about a broken rib.  She declines any additional medications or incentive spirometer.  She will be discharged home with continued supportive care.  Vital signs reviewed and are as follows: BP 118/79   Pulse 95   Temp 98 F (36.7 C) (Oral)   Resp 16   SpO2 99%      MDM Rules/Calculators/A&P                          Patient with suspected rib fracture.  Injury 4 days ago.  She looks quite well.  No significant findings to suggest intra-abdominal injury.  Patient be discharged.   Final Clinical Impression(s) / ED Diagnoses Final diagnoses:  Closed fracture of one rib of left side, initial encounter    Rx / DC Orders ED Discharge Orders    None       Carlisle Cater, PA-C 01/03/21 1621    Valarie Merino, MD 01/03/21 2317

## 2021-01-03 NOTE — ED Triage Notes (Signed)
Emergency Medicine Provider Triage Evaluation Note  Regina Fernandez , a 40 y.o. female  was evaluated in triage.  Pt complains of left rib pain since fall from a horse on Sunday. Thrown from the horse, landed on the ground. Pain in left posterior ribs since. No hematuria, SHOB, abdominal pain. 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   Review of Systems  Positive: Chest wall pain Negative: SHOB, abdominal pain  Physical Exam  There were no vitals taken for this visit. Gen:   Awake, no distress   HEENT:  Atraumatic  Resp:  Normal effort  Cardiac:  Normal rate  Abd:   Nondistended, nontender  MSK:   Moves extremities without difficulty  Neuro:  Speech clear   Medical Decision Making  Medically screening exam initiated at 2:58 PM.  Appropriate orders placed.  Fermin Schwab Yan was informed that the remainder of the evaluation will be completed by another provider, this initial triage assessment does not replace that evaluation, and the importance of remaining in the ED until their evaluation is complete.  Clinical Impression     Tacy Learn, PA-C 01/03/21 1505

## 2021-01-03 NOTE — ED Triage Notes (Signed)
[place] off her horse on Sunday.  No loc.  Patient was wearing a helmet.  Reports hit left side and left rib cage is hurting.

## 2021-01-03 NOTE — ED Provider Notes (Signed)
[place]    CSN: [id number] Arrival date & time: 01/03/21  1350      History   Chief Complaint Chief Complaint  Patient presents with  . Appointment    2:00 pm    HPI Regina Fernandez is a 40 y.o. female presenting with rib and abdominal pain following fall from horse 3 days ago.  Medical history noncontributory.  States she was thrown off her horse 3 days ago, landing on her left hip and then falling onto her left ribs.  Since then endorses significant left-sided rib pain.  Initially with shortness of breath but this is improved on its own.  Also notes some left upper quadrant abdominal pain.  States she is having normal bowel movements, last one was this morning and was normal, though she did have to use a stool softener to elicit this.  Denies changes in urinary function, denies hematuria.  Was wearing her helmet, denies loss of consciousness, headaches, dizziness, vision changes.  Has been using a family members muscle relaxer with some improvement, as well as ibuprofen 800 mg. 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  HPI  Past Medical History:  Diagnosis Date  . Allergy   . KNEE PAIN, LEFT 07/25/2009   Qualifier: Diagnosis of  By: Oneida Alar MD, KARL    . Low back pain 06/03/2011  . SPRAIN AND STRAIN OF CRUCIATE LIGAMENT OF KNEE 07/25/2009   Qualifier: Diagnosis of  By: FIELDS MD, KARL      There are no problems to display for this patient.   Past Surgical History:  Procedure Laterality Date  . FRACTURE SURGERY Left 1990   elbow    OB History   No obstetric history on file.      Home Medications    Prior to Admission medications   Medication Sig Start Date End Date Taking? Authorizing Provider  acetaminophen (TYLENOL) 325 MG suppository Place 325 mg rectally every 4 (four) hours as needed.   Yes [provider]  5-Hydroxytryptophan (5-HTP) 50 MG CAPS Take by mouth.    [provider]  calcium elemental as carbonate (BARIATRIC TUMS ULTRA) 400 MG  tablet Chew 1,000 mg by mouth.    [provider]  Cholecalciferol (VITAMIN D3) 2000 UNITS capsule Take by mouth.    [provider]  Drospiren-Eth Estrad-Levomefol (BEYAZ PO) Take by mouth daily.    [provider]  ibuprofen (ADVIL,MOTRIN) 800 MG tablet Take 800 mg by mouth every 8 (eight) hours as needed.    [provider]  Melatonin 5 MG TABS Take by mouth.    [provider]  Multiple Vitamin (THERA) TABS Take 1 tablet by mouth.    [provider]    Family History Family History  Problem Relation Age of Onset  . Hyperlipidemia Father   . Sudden death Neg Hx   . Hypertension Neg Hx   . Heart attack Neg Hx   . Diabetes Neg Hx     Social History Social History   Tobacco Use  . Smoking status: Former Research scientist (life sciences)  . Smokeless tobacco: Never Used  Vaping Use  . Vaping Use: Never used  Substance Use Topics  . Alcohol use: Yes    Alcohol/week: 0.0 standard drinks    Comment: rare  . Drug use: No     Allergies   Miconazole   Review of Systems Review of Systems  Gastrointestinal: Positive for abdominal pain.  Musculoskeletal:       L rib pain L  hip pain  All other systems reviewed and are negative.    Physical Exam Triage Vital Signs ED Triage Vitals  Enc Vitals Group     BP      Pulse      Resp      Temp      Temp src      SpO2      Weight      Height      Head Circumference      Peak Flow      Pain Score      Pain Loc      Pain Edu?      Excl. in [place]?    No data found.  Updated Vital Signs BP 116/78 (BP Location: Right Arm)   Pulse 73   Temp 99.3 F (37.4 C) (Oral)   Resp 18   SpO2 100%   Visual Acuity Right Eye Distance:   Left Eye Distance:   Bilateral Distance:    Right Eye Near:   Left Eye Near:    Bilateral Near:     Physical Exam Vitals reviewed.  Constitutional:      General: She is not in acute distress.    Appearance: Normal appearance. She is not ill-appearing.  HENT:      Head: Normocephalic and atraumatic.  Eyes:     Extraocular Movements: Extraocular movements intact.     Pupils: Pupils are equal, round, and reactive to light.  Cardiovascular:     Rate and Rhythm: Normal rate and regular rhythm.     Heart sounds: Normal heart sounds.  Pulmonary:     Effort: Pulmonary effort is normal.     Breath sounds: Normal breath sounds and air entry.  Chest:     Comments: L distal ribs with diffuse tenderness to palpation, worst over anterior aspect. No obvious bony deformity. No ecchymosis.   L hip with faint ecchymosis and TTP but no bony deformity. ROM intact and without pain. No pelvic instability. Abdominal:     General: Bowel sounds are normal.     Palpations: Abdomen is soft.     Tenderness: There is abdominal tenderness in the left upper quadrant. There is no right CVA tenderness, left CVA tenderness, guarding or rebound. Negative signs include Murphy's sign, Rovsing's sign and McBurney's sign.     Comments: LUQ significantly tender to palpation    Musculoskeletal:     Cervical back: Normal range of motion. No swelling, deformity, signs of trauma, rigidity, spasms, tenderness, bony tenderness or crepitus. No pain with movement.     Thoracic back: No swelling, deformity, signs of trauma, spasms, tenderness or bony tenderness. Normal range of motion. No scoliosis.     Lumbar back: No swelling, deformity, signs of trauma, spasms, tenderness or bony tenderness. Normal range of motion. Negative right straight leg raise test and negative left straight leg raise test. No scoliosis.     Comments:   Absolutely no other injury, deformity, tenderness, ecchymosis, abrasion.  Skin:    Capillary Refill: Capillary refill takes less than 2 seconds.  Neurological:     General: No focal deficit present.     Mental Status: She is alert.     Cranial Nerves: No cranial nerve deficit.  Psychiatric:        Mood and Affect: Mood normal.        Behavior: Behavior normal.         Thought Content: Thought content normal.  Judgment: Judgment normal.      UC Treatments / Results  Labs (all labs ordered are listed, but only abnormal results are displayed) Labs Reviewed - No data to display  EKG   Radiology No results found.  Procedures Procedures (including critical care time)  Medications Ordered in UC Medications - No data to display  Initial Impression / Assessment and Plan / UC Course  I have reviewed the triage vital signs and the nursing notes.  Pertinent labs & imaging results that were available during my care of the patient were reviewed by me and considered in my medical decision making (see chart for details).     This patient is a 40 year old female presenting with rib and left upper quadrant pain following fall that occurred 3 days ago.  On exam, she is exquisitely tender over distal anterior ribs and left upper quadrant. I have concern for abdominal trauma, including splenic ruptur. I am sending this patient to [facility] ED for further evaluation and management, including abdominal imaging. Will defer rib xray to them as well. Spoke with attending physician Dr. Lanny Cramp who is in agreement with this plan. Patient is hemodynamically stable for transport in personal vehicle.  Final Clinical Impressions(s) / UC Diagnoses   Final diagnoses:  LUQ pain  Fall from horse, initial encounter  Rib injury  Contusion of left hip, initial encounter     Discharge Instructions     -Head straight to [facility] ED for further evaluation and management of rib and abdominal pain. You need a scan of the abdomen to ensure to internal bleeding. If you develop new symptoms on the way like dizziness, shortness of breath, lightheadedness- stop and call 911.    ED Prescriptions    None     PDMP not reviewed this encounter.   Hazel Sams, PA-C 01/03/21 1500

## 2021-01-03 NOTE — ED Notes (Signed)
Patient Alert and oriented to baseline. Stable and ambulatory to baseline. Patient verbalized understanding of the discharge instructions.  Patient belongings were taken by the patient.   

## 2021-01-03 NOTE — ED Notes (Signed)
Patient is being discharged from the Urgent Care and sent to the Emergency Department via POV. Per Phillip Heal, NP, patient is in need of higher level of care due to needing CT. Patient is aware and verbalizes understanding of plan of care.  Vitals:   01/03/21 1425  BP: 116/78  Pulse: 73  Resp: 18  Temp: 99.3 F (37.4 C)  SpO2: 100%

## 2021-01-03 NOTE — Discharge Instructions (Signed)
Please read and follow all provided instructions.  Your diagnoses today include:  1. Closed fracture of one rib of left side, initial encounter     Tests performed today include:  Vital signs. See below for your results today.   Chest x-ray - shows suspected rib fracture  Medications prescribed:   None  Take any prescribed medications only as directed.  Home care instructions:  Follow any educational materials contained in this packet.  BE VERY CAREFUL not to take multiple medicines containing Tylenol (also called acetaminophen). Doing so can lead to an overdose which can damage your liver and cause liver failure and possibly death.   Follow-up instructions: Please follow-up with your primary care provider in the next 3 days for further evaluation of your symptoms.   Return instructions:   Please return to the Emergency Department if you experience worsening symptoms.   Please return if you have any other emergent concerns.  Additional Information:  Your vital signs today were: BP (!) 129/100 (BP Location: Right Arm)   Pulse (!) 101   Temp 98.3 F (36.8 C) (Oral)   Resp 15   SpO2 98%  If your blood pressure (BP) was elevated above 135/85 this visit, please have this repeated by your doctor within one month. --------------

## 2021-01-03 NOTE — Discharge Instructions (Signed)
-  Head straight to [facility] ED for further evaluation and management of rib and abdominal pain. You need a scan of the abdomen to ensure to internal bleeding. If you develop new symptoms on the way like dizziness, shortness of breath, lightheadedness- stop and call 911.

## 2021-01-03 NOTE — ED Notes (Signed)
Still being registered

## 2021-01-04 ENCOUNTER — Emergency Department (HOSPITAL_COMMUNITY)
Admission: EM | Admit: 2021-01-04 | Discharge: 2021-01-05 | Disposition: A | Discharge status: Home / Self Care | Payer: 59 | Attending: Emergency Medicine | Admitting: Emergency Medicine

## 2021-01-04 ENCOUNTER — Emergency Department (HOSPITAL_COMMUNITY): Payer: 59

## 2021-01-04 ENCOUNTER — Encounter (HOSPITAL_COMMUNITY): Payer: Self-pay | Admitting: *Deleted

## 2021-01-04 ENCOUNTER — Other Ambulatory Visit: Payer: Self-pay

## 2021-01-04 DIAGNOSIS — S0990XA Unspecified injury of head, initial encounter: Secondary | ICD-10-CM | POA: Diagnosis present

## 2021-01-04 DIAGNOSIS — Z87891 Personal history of nicotine dependence: Secondary | ICD-10-CM | POA: Diagnosis not present

## 2021-01-04 DIAGNOSIS — Y9352 Activity, horseback riding: Secondary | ICD-10-CM | POA: Insufficient documentation

## 2021-01-04 NOTE — ED Triage Notes (Signed)
The pt fell off her horse on Sunday  She has had a headache since then and she was seen here last pm for the same  No loc from the fall  lmp yesterday

## 2021-01-04 NOTE — ED Triage Notes (Signed)
The pt was unable to sign the computer is not working and all the other rooms in triage are  full

## 2021-01-04 NOTE — ED Triage Notes (Signed)
Emergency Medicine Provider Triage Evaluation Note  Regina Fernandez , a 40 y.o. female  was evaluated in triage.  Pt complains of constant dull headache since 5 days prior.  It has been unchanged over this time.  Estimates fall was approximately 71ft.  Reports that she was in her usual.  Patient denies any structural damage to her helmet.  Patient denies any visual disturbance, no facial asymmetry, slurred speech, focal neurological deficit, saddle anesthesia, bowel or bladder dysfunction, loss of consciousness.  Review of Systems  Positive: Headache Negative: acial asymmetry, slurred speech, focal neurological deficit, saddle anesthesia, bowel or bladder dysfunction, loss of consciousness  Physical Exam  BP (!) 147/84 (BP Location: Right Arm)   Pulse 86   Temp 98 F (36.7 C) (Oral)   Resp 18   SpO2 100%  Gen:   Awake, no distress   HEENT:  Atraumatic, EOM intact, PERRL Resp:  Normal effort  Cardiac:  Normal rate  MSK:   Moves extremities without difficulty  Neuro:  Speech clear, CN II through XII intact,   Medical Decision Making  Medically screening exam initiated at 6:13 PM.  Appropriate orders placed.  Fermin Schwab Bodey was informed that the remainder of the evaluation will be completed by another provider, this initial triage assessment does not replace that evaluation, and the importance of remaining in the ED until their evaluation is complete.  Clinical Impression   The patient appears stable so that the remainder of the work up may be completed by another provider.      [name], [place] 01/04/21 1816

## 2021-01-05 NOTE — ED Provider Notes (Signed)
[facility] EMER[facility]ENCY DEPARTMENT Provider Note   CSN: [id number] Arrival date & time: 01/04/21  1658     History Chief Complaint  Patient presents with  . Headache    Regina Fernandez is a 40 y.o. female.  Patient is a 40 year old female with no significant past medical history.  She presents today for evaluation of headache following a fall from a horse.  Patient states that 4 days ago she had a hard fall from a horse at a moderate rate of speed.  She landed on her left chest and struck her head on the ground.  There was no loss of consciousness, but there were abrasions and damage to the helmet.  She was seen yesterday with rib pain and was found to have a fractured rib, but no pneumothorax.  She returns today stating that she has a headache and this is unusual for her.  She denies any visual disturbances or neck pain.  The history is provided by the patient.       Past Medical History:  Diagnosis Date  . Allergy   . KNEE PAIN, LEFT 07/25/2009   Qualifier: Diagnosis of  By: Oneida Alar MD, KARL    . Low back pain 06/03/2011  . SPRAIN AND STRAIN OF CRUCIATE LIGAMENT OF KNEE 07/25/2009   Qualifier: Diagnosis of  By: FIELDS MD, KARL      There are no problems to display for this patient.   Past Surgical History:  Procedure Laterality Date  . FRACTURE SURGERY Left 1990   elbow     OB History   No obstetric history on file.     Family History  Problem Relation Age of Onset  . Hyperlipidemia Father   . Sudden death Neg Hx   . Hypertension Neg Hx   . Heart attack Neg Hx   . Diabetes Neg Hx     Social History   Tobacco Use  . Smoking status: Former Research scientist (life sciences)  . Smokeless tobacco: Never Used  Vaping Use  . Vaping Use: Never used  Substance Use Topics  . Alcohol use: Yes    Alcohol/week: 0.0 standard drinks    Comment: rare  . Drug use: No    Home Medications Prior to Admission medications   Medication Sig Start Date End Date Taking?  Authorizing Provider  5-Hydroxytryptophan (5-HTP) 50 MG CAPS Take by mouth.    [provider]  acetaminophen (TYLENOL) 325 MG suppository Place 325 mg rectally every 4 (four) hours as needed.    [provider]  calcium elemental as carbonate (BARIATRIC TUMS ULTRA) 400 MG tablet Chew 1,000 mg by mouth.    [provider]  Cholecalciferol (VITAMIN D3) 2000 UNITS capsule Take by mouth.    [provider]  Drospiren-Eth Estrad-Levomefol (BEYAZ PO) Take by mouth daily.    [provider]  ibuprofen (ADVIL,MOTRIN) 800 MG tablet Take 800 mg by mouth every 8 (eight) hours as needed.    [provider]  Melatonin 5 MG TABS Take by mouth.    [provider]  Multiple Vitamin (THERA) TABS Take 1 tablet by mouth.    [provider]    Allergies    Miconazole  Review of Systems   Review of Systems  All other systems reviewed and are negative.   Physical Exam Updated Vital Signs BP 122/83 (BP Location: Left Arm)   Pulse 79   Temp 98.4 F (36.9 C) (Oral)   Resp 18   Ht 5'  3" (1.6 m)   Wt 62.6 kg   LMP 01/03/2021   SpO2 99%   BMI 24.45 kg/m   Physical Exam Vitals and nursing note reviewed.  Constitutional:      General: She is not in acute distress.    Appearance: She is well-developed. She is not diaphoretic.  HENT:     Head: Normocephalic and atraumatic.  Eyes:     General: No visual field deficit.    Extraocular Movements: Extraocular movements intact.     Pupils: Pupils are equal, round, and reactive to light.  Cardiovascular:     Rate and Rhythm: Normal rate and regular rhythm.     Heart sounds: No murmur heard. No friction rub. No gallop.   Pulmonary:     Effort: Pulmonary effort is normal. No respiratory distress.     Breath sounds: Normal breath sounds. No wheezing.  Abdominal:     General: Bowel sounds are normal. There is no distension.     Palpations: Abdomen is soft.     Tenderness: There is no  abdominal tenderness.  Musculoskeletal:        General: Normal range of motion.     Cervical back: Normal range of motion and neck supple.  Skin:    General: Skin is warm and dry.  Neurological:     Mental Status: She is alert and oriented to person, place, and time.     Cranial Nerves: No cranial nerve deficit, dysarthria or facial asymmetry.     ED Results / Procedures / Treatments   Labs (all labs ordered are listed, but only abnormal results are displayed) Labs Reviewed - No data to display  EKG None  Radiology DG Ribs Unilateral W/Chest Left  Result Date: 01/03/2021 CLINICAL DATA:  Fall from horse, chest wall pain, left flank pain, rib pain EXAM: LEFT RIBS AND CHEST - 3+ VIEW COMPARISON:  None. FINDINGS: Bibasilar atelectasis. No visible effusion or pneumothorax. Heart is normal size. Questionable anterolateral left 10th rib fracture, nondisplaced. IMPRESSION: Questionable nondisplaced left anterolateral 10th rib fracture. Bibasilar atelectasis. Electronically Signed   By: Rolm Baptise M.D.   On: 01/03/2021 15:39   CT Head Wo Contrast  Result Date: 01/04/2021 CLINICAL DATA:  Headache since fall from horse 5 days ago EXAM: CT HEAD WITHOUT CONTRAST TECHNIQUE: Contiguous axial images were obtained from the base of the skull through the vertex without intravenous contrast. COMPARISON:  None. FINDINGS: Brain: No evidence of acute infarction, hemorrhage, hydrocephalus, extra-axial collection or mass lesion/mass effect. Vascular: No hyperdense vessel or unexpected calcification. Skull: Normal. Negative for fracture or focal lesion. Sinuses/Orbits: The paranasal sinuses and mastoid air cells are predominantly clear. Orbits are grossly unremarkable. Other: None. IMPRESSION: No acute intracranial findings. Electronically Signed   By: Dahlia Bailiff MD   On: 01/04/2021 19:10    Procedures Procedures   Medications Ordered in ED Medications - No data to display  ED Course  I have  reviewed the triage vital signs and the nursing notes.  Pertinent labs & imaging results that were available during my care of the patient were reviewed by me and considered in my medical decision making (see chart for details).    MDM Rules/Calculators/A&P  Patient presenting with headache status post head trauma.  Head CT was ordered in triage and was unremarkable.  Patient is neurologically intact and appears comfortable.  She will be discharged with Tylenol/ibuprofen and return as needed.  Final Clinical Impression(s) / ED Diagnoses Final diagnoses:  None  Rx / DC Orders ED Discharge Orders    None       Veryl Speak, MD 01/05/21 [phone]

## 2021-01-05 NOTE — Discharge Instructions (Addendum)
Take Tylenol 1000 mg rotated with ibuprofen 600 mg every 4 hours as needed for pain.  Return to the emergency department if you develop severe headache, convulsions, difficulty with balance, or other new and concerning symptoms.

## 2021-05-28 ENCOUNTER — Ambulatory Visit: Payer: 59 | Admitting: Sports Medicine

## 2021-05-28 ENCOUNTER — Ambulatory Visit
Admission: RE | Admit: 2021-05-28 | Discharge: 2021-05-28 | Disposition: A | Discharge status: Home / Self Care | Payer: 59 | Source: Ambulatory Visit | Attending: Sports Medicine | Admitting: Sports Medicine

## 2021-05-28 ENCOUNTER — Encounter: Payer: Self-pay | Admitting: Sports Medicine

## 2021-05-28 ENCOUNTER — Other Ambulatory Visit: Payer: Self-pay

## 2021-05-28 VITALS — Ht 63.0 in | Wt 135.0 lb

## 2021-05-28 DIAGNOSIS — S93402A Sprain of unspecified ligament of left ankle, initial encounter: Secondary | ICD-10-CM

## 2021-05-28 DIAGNOSIS — M25572 Pain in left ankle and joints of left foot: Secondary | ICD-10-CM | POA: Diagnosis not present

## 2021-05-28 DIAGNOSIS — S93499A Sprain of other ligament of unspecified ankle, initial encounter: Secondary | ICD-10-CM | POA: Insufficient documentation
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
## 2021-05-28 NOTE — Assessment & Plan Note (Signed)
Due to mechanism of injury high suspicion for torn ligament of left ankle, + tenderness to palpation of lateral malleolus, will send patient for xrays of left ankle to rule out avulsion fracture and follow up with phone call once results are available  Patient counseled to continue icing and elevating left ankle  Patient advised to wear left ankle lace-up brace and rest ankle as much as possible

## 2021-05-28 NOTE — Patient Instructions (Signed)
Please go to [address] for imaging of your ankle x-rays if possible.  Will follow with you once the results are available.  We will provide you with exercises and stretches for your ankle and treat you conservatively as an ankle sprain and totally have results from x-rays.  Please continue to ice and elevate your ankle to help with swelling and inflammation.  We also advised that you wear a brace to support her ankle

## 2022-04-09 IMAGING — CT CT HEAD W/O CM
2 series · 15 of 37 positions shown, 18 images · non-contrast
Comparison: None.

CLINICAL DATA: Headache since fall from horse 5 days ago

EXAM:
CT HEAD WITHOUT CONTRAST
TECHNIQUE: Contiguous axial images were obtained from the base of the skull
through the vertex without intravenous contrast.

[Series 3: head 5.0 h30s · axial · 0.42mm/px · z∈[-110,+15]mm · 12 of 30 slices shown, 15 images]
[im 3/30  brain]
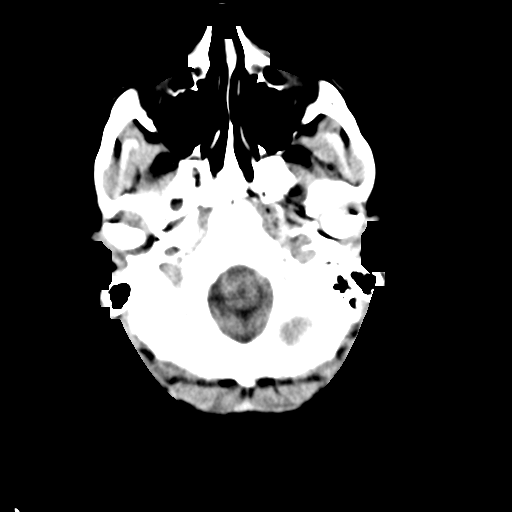
[im 3/30  bone]
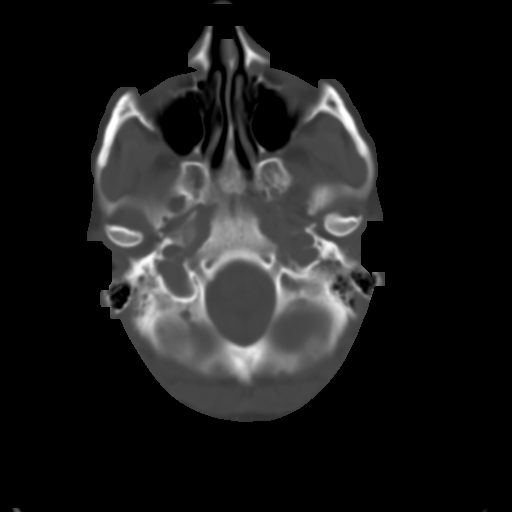
[im 5/30  brain]
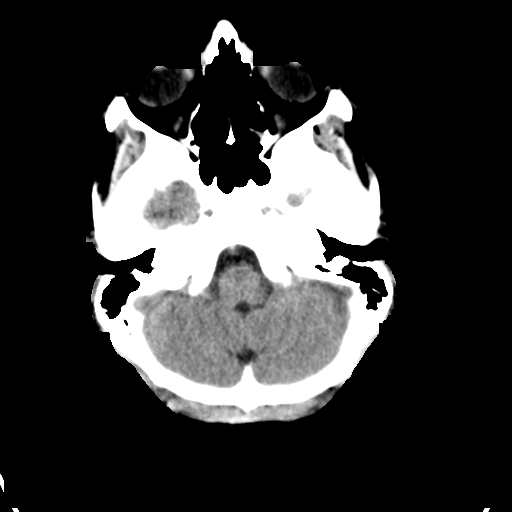
[im 7/30  brain]
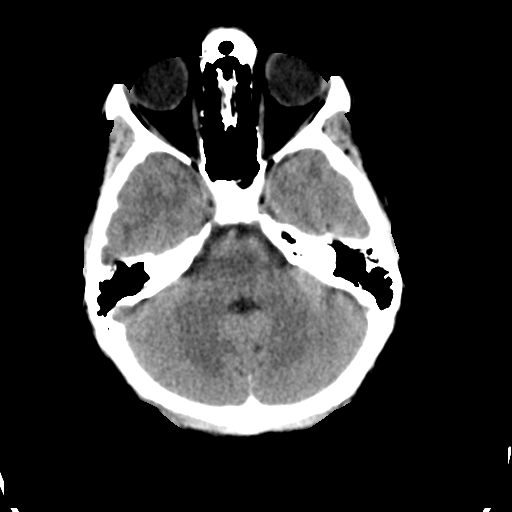
[im 10/30  brain]
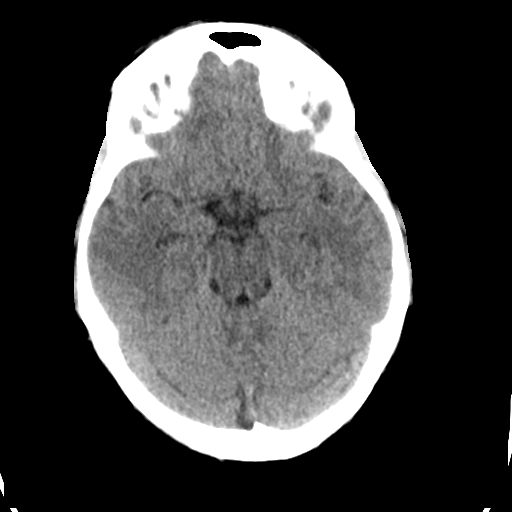
[im 12/30  brain]
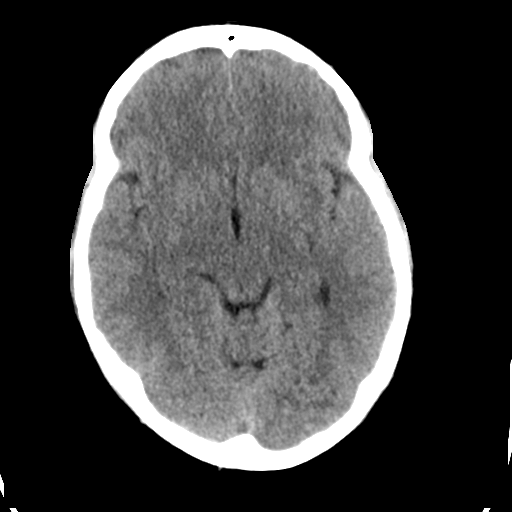
[im 12/30  bone]
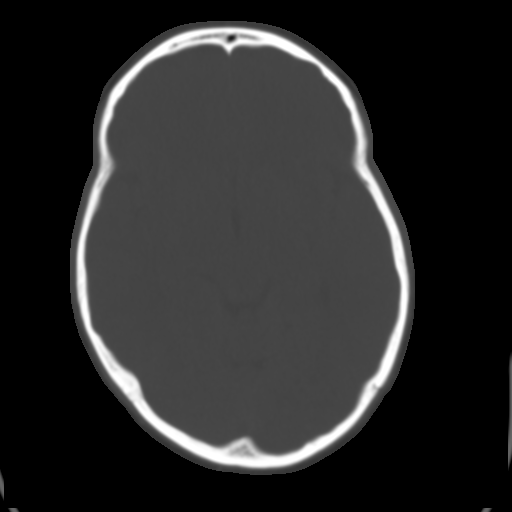
[im 14/30  brain]
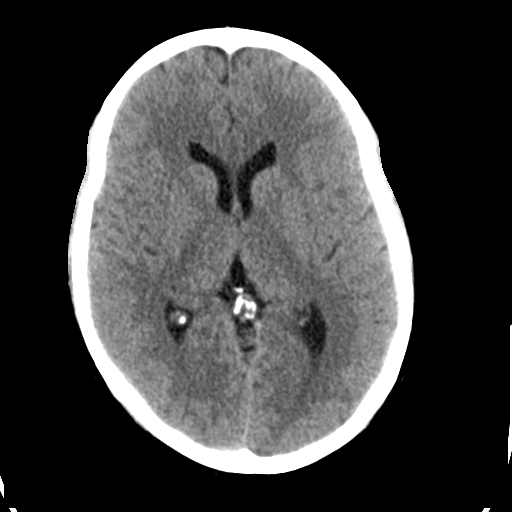
[im 17/30  brain]
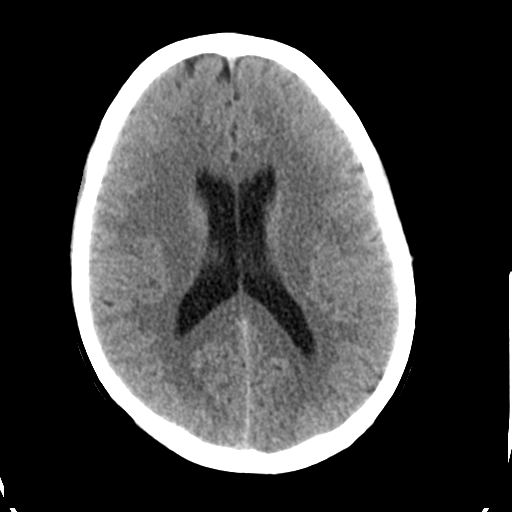
[im 19/30  brain]
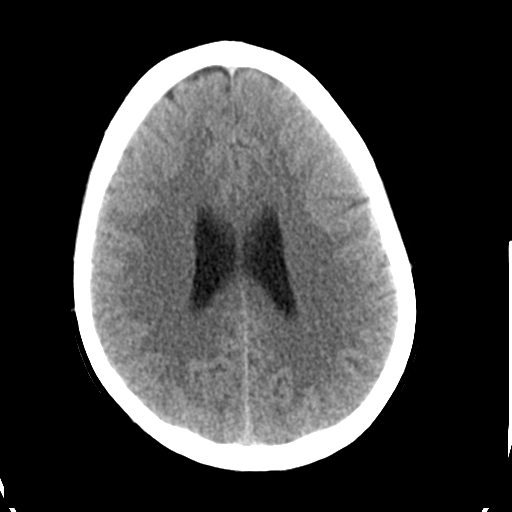
[im 21/30  brain]
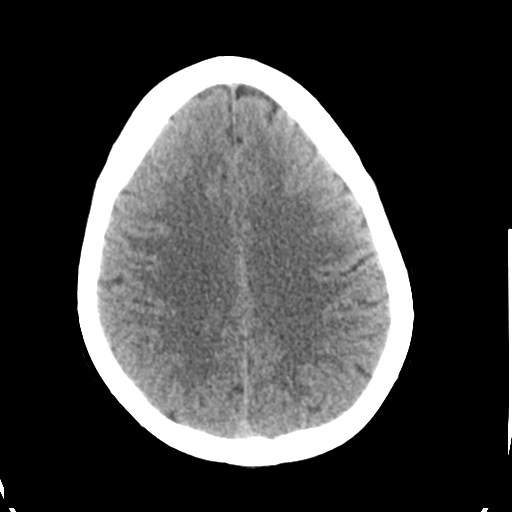
[im 21/30  bone]
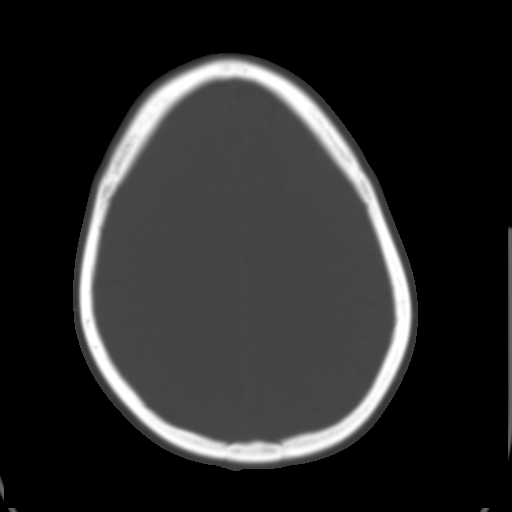
[im 24/30  brain]
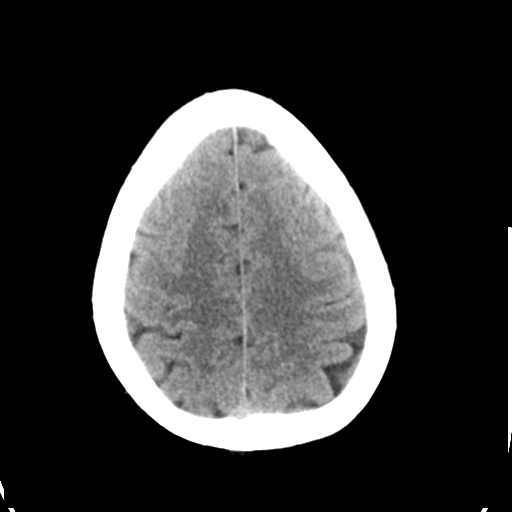
[im 26/30  brain]
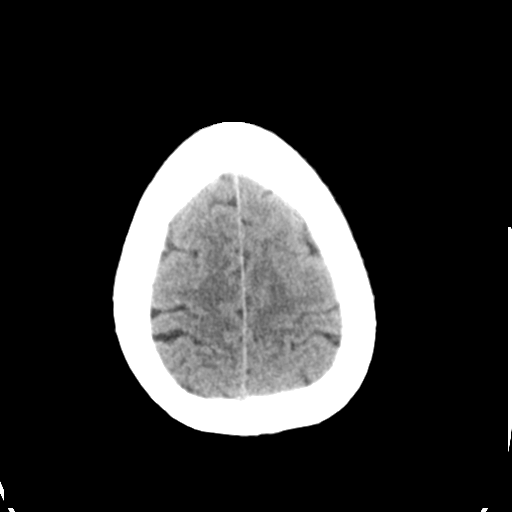
[im 28/30  brain]
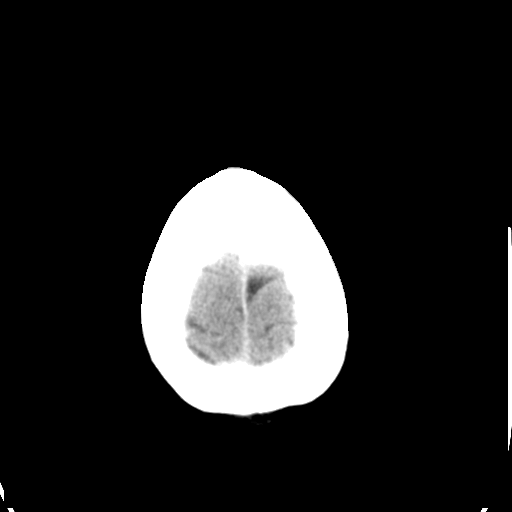

[Series 6: head 3.0 mpr sag · sagittal · 0.29mm/px · 3 of 63 slices shown]
[im 21/63  brain]
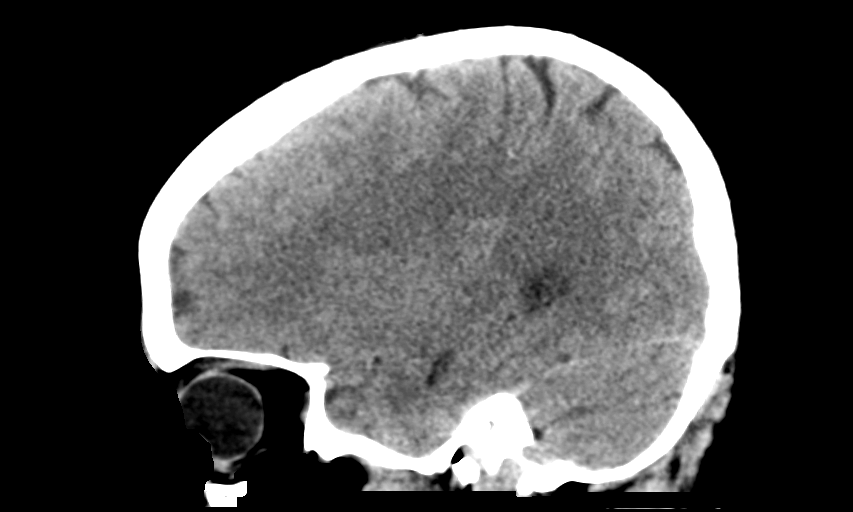
[im 32/63  brain]
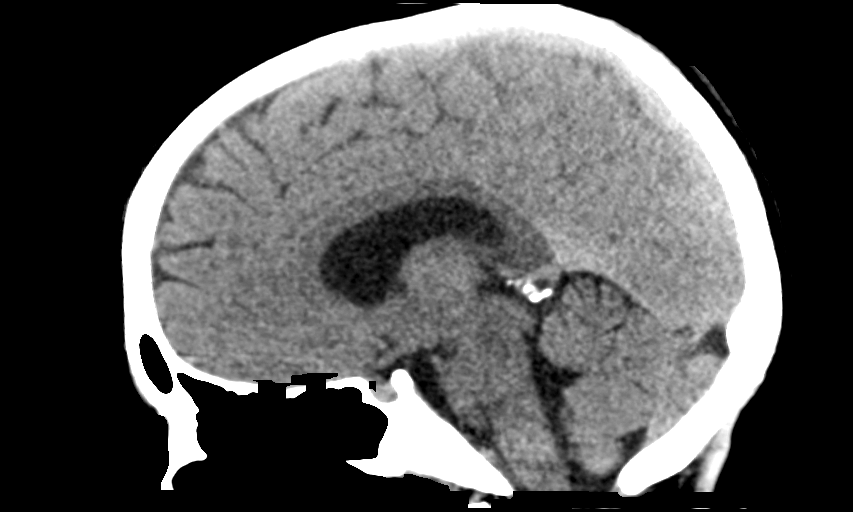
[im 42/63  brain]
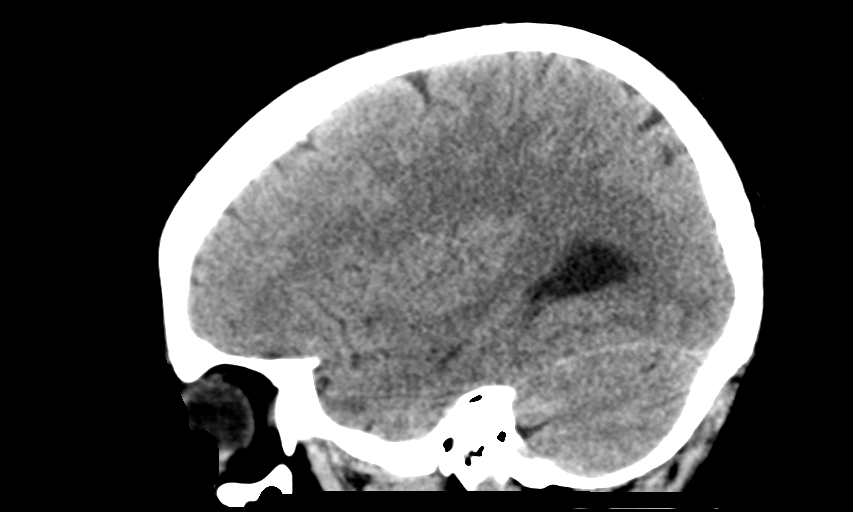

[15 of 37 positions shown; findings below may reference images not displayed]

FINDINGS: Brain: No evidence of acute infarction, hemorrhage, hydrocephalus,
extra-axial collection or mass lesion/mass effect.

Vascular: No hyperdense vessel or unexpected calcification.

Skull: Normal. Negative for fracture or focal lesion.

Sinuses/Orbits: The paranasal sinuses and mastoid air cells are
predominantly clear. Orbits are grossly unremarkable.

Other: None.
IMPRESSION: No acute intracranial findings.

## 2022-07-21 ENCOUNTER — Other Ambulatory Visit (HOSPITAL_COMMUNITY): Payer: Self-pay

## 2022-07-21 MED ORDER — AMOXICILLIN-POT CLAVULANATE 875-125 MG PO TABS
1.0000 | ORAL_TABLET | Freq: Two times a day (BID) | ORAL | 0 refills | Status: DC
  Filled 2022-07-21: qty 20, 10d supply, fill #0

## 2022-07-29 ENCOUNTER — Other Ambulatory Visit: Payer: Self-pay | Admitting: Obstetrics & Gynecology

## 2022-07-29 DIAGNOSIS — N611 Abscess of the breast and nipple: Secondary | ICD-10-CM

## 2022-10-23 ENCOUNTER — Other Ambulatory Visit: Payer: 59

## 2022-12-03 ENCOUNTER — Other Ambulatory Visit: Payer: 59

## 2023-01-07 ENCOUNTER — Other Ambulatory Visit: Payer: 59

## 2023-07-27 ENCOUNTER — Other Ambulatory Visit (HOSPITAL_COMMUNITY): Payer: Self-pay | Admitting: Obstetrics & Gynecology

## 2023-07-27 DIAGNOSIS — N611 Abscess of the breast and nipple: Secondary | ICD-10-CM

## 2023-11-16 DIAGNOSIS — Z1329 Encounter for screening for other suspected endocrine disorder: Secondary | ICD-10-CM | POA: Diagnosis not present

## 2023-11-16 DIAGNOSIS — Z1331 Encounter for screening for depression: Secondary | ICD-10-CM | POA: Diagnosis not present

## 2023-11-16 DIAGNOSIS — Z1231 Encounter for screening mammogram for malignant neoplasm of breast: Secondary | ICD-10-CM | POA: Diagnosis not present

## 2023-11-16 DIAGNOSIS — Z01419 Encounter for gynecological examination (general) (routine) without abnormal findings: Secondary | ICD-10-CM | POA: Diagnosis not present

## 2023-11-16 DIAGNOSIS — Z0000 Encounter for general adult medical examination without abnormal findings: Secondary | ICD-10-CM | POA: Diagnosis not present

## 2023-11-16 DIAGNOSIS — Z13220 Encounter for screening for lipoid disorders: Secondary | ICD-10-CM | POA: Diagnosis not present

## 2023-11-16 DIAGNOSIS — Z131 Encounter for screening for diabetes mellitus: Secondary | ICD-10-CM | POA: Diagnosis not present

## 2024-01-14 ENCOUNTER — Encounter: Payer: Self-pay | Admitting: Physician Assistant

## 2024-03-01 NOTE — Progress Notes (Signed)
 Brigitte Canard, PA-C [address] [place], [place]  [address] Phone: [phone]   Gastroenterology Consultation  Referring Provider:     Terri Fester, MD Primary Care Physician:  Terri Fester, MD Primary Gastroenterologist:  Brigitte Canard, PA-C / Darol Elizabeth, MD  Reason for Consultation:     Family history of colon cancer, discuss colonoscopy        HPI:   Regina Fernandez is a 43 y.o. y/o female referred for consultation & management  by Terri Fester, MD.    Family history significant for maternal grandmother who had colon cancer.  Patient's mother had cancerous colon polyps and precancerous colon polyps removed.  Several maternal uncles also had cancerous colon polyps removed.  Current symptoms: Patient reports chronic constipation for many years.  Constipation is getting worse.  She is taking MiraLAX every day with great benefit.  On MiraLAX she has soft bowel movement daily.  If she does not take MiraLAX she has hard stools, straining, and frequent bowel movements.  MiraLAX typically works well.  She tried Dulcolax which caused abdominal cramping.  She denies rectal bleeding, weight loss, anemia, or alarm symptoms.  No previous colonoscopy or GI evaluation.  Patient is requesting to have a colonoscopy due to her strong family history of colon cancer and colon polyps.  Past Medical History:  Diagnosis Date   Allergy    Anxiety    Nursing School   HLD (hyperlipidemia)    IBS (irritable bowel syndrome)    KNEE PAIN, LEFT 07/25/2009   Qualifier: Diagnosis of  By: Nolene Baumgarten MD, KARL     Low back pain 06/03/2011   SPRAIN AND STRAIN OF CRUCIATE LIGAMENT OF KNEE 07/25/2009   Qualifier: Diagnosis of  By: Nolene Baumgarten MD, KARL      Past Surgical History:  Procedure Laterality Date   DILATION AND CURETTAGE OF UTERUS     FRACTURE SURGERY Left 09/15/1988   elbow   WISDOM TOOTH EXTRACTION      Prior to Admission medications   Medication Sig Start Date End Date  Taking? Authorizing Provider  5-Hydroxytryptophan (5-HTP) 50 MG CAPS Take by mouth.    [provider]  acetaminophen (TYLENOL) 325 MG suppository Place 325 mg rectally every 4 (four) hours as needed.    [provider]  amoxicillin -clavulanate (AUGMENTIN ) 875-125 MG tablet Take 1 tablet by mouth every 12 (twelve) hours. 07/21/22     calcium elemental as carbonate (BARIATRIC TUMS ULTRA) 400 MG tablet Chew 1,000 mg by mouth.    [provider]  Cholecalciferol (VITAMIN D3) 2000 UNITS capsule Take by mouth.    [provider]  Drospiren-Eth Estrad-Levomefol (BEYAZ PO) Take by mouth daily.    [provider]  ibuprofen (ADVIL,MOTRIN) 800 MG tablet Take 800 mg by mouth every 8 (eight) hours as needed.    [provider]  Melatonin 5 MG TABS Take by mouth.    [provider]  Multiple Vitamin (THERA) TABS Take 1 tablet by mouth.    [provider]    Family History  Problem Relation Age of Onset   Colon polyps Mother    Hyperlipidemia Father    Colon cancer Maternal Grandmother    Diabetes Maternal Grandfather    Heart disease Maternal Grandfather    Diabetes Paternal Grandmother    Kidney disease Paternal Grandmother    Colon polyps Maternal Uncle    Heart disease Maternal Uncle    Graves' disease Maternal Uncle  Colon polyps Maternal Uncle    Sudden death Neg Hx    Hypertension Neg Hx    Heart attack Neg Hx      Social History   Tobacco Use   Smoking status: Former    Types: Cigarettes    Start date: 2008   Smokeless tobacco: Never   Tobacco comments:    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   Physical Exam:  BP 100/70 (BP Location: Left Arm, Patient Position: Sitting, Cuff Size: Normal)   Pulse 76   Ht 5' 3 (1.6 m) Comment: height measured without shoes  Wt 140 lb (63.5 kg)   BMI 24.80 kg/m  No LMP recorded. (Menstrual status: IUD).  General:   Alert,  Well-developed, well-nourished, pleasant and cooperative in NAD Lungs:  Respirations even and unlabored.  Clear throughout to auscultation.   No wheezes, crackles, or rhonchi. No acute distress. Heart:  Regular rate and rhythm; no murmurs, clicks, rubs, or gallops. Abdomen:  Normal bowel sounds.  No bruits.  Soft, and non-distended without masses, hepatosplenomegaly or hernias noted.  No Tenderness.  No guarding or rebound tenderness.    Neurologic:  Alert and oriented x3;  grossly normal neurologically. Psych:  Alert and cooperative. Normal mood and affect.  Imaging Studies: No results found.  Labs: CBC    Component Value Date/Time   WBC 12.3 (A) 10/13/2014 1409   RBC 4.36 10/13/2014 1409   HGB 12.5 10/13/2014 1409   HCT 38.8 10/13/2014 1409   MCV 89.0 10/13/2014 1409   MCH 28.7 10/13/2014 1409   MCHC 32.3 10/13/2014 1409    CMP     Component Value Date/Time   NA 140 10/11/2014 1354   K 3.9 10/11/2014 1354   CL 106 10/11/2014 1354   CO2 26 10/11/2014 1354   GLUCOSE 80 10/11/2014 1354   BUN 12 10/11/2014 1354   CREATININE 0.69 10/11/2014 1354   CALCIUM 8.8 10/11/2014 1354   PROT 6.5 10/11/2014 1354   ALBUMIN 3.8 10/11/2014 1354   AST 16 10/11/2014 1354   ALT 14 10/11/2014 1354   ALKPHOS 54 10/11/2014 1354   BILITOT 0.5 10/11/2014 1354    Assessment and Plan:   Regina Fernandez is a 43 y.o. y/o female has been referred for:   Chronic Idiopathic Constipation -improved and controlled on MiraLAX. - Continue OTC Miralax Powder; Mix 1 capful in 6 to 8 ounces of a drink once daily - Recommend high-fiber diet, 30 g of fiber daily - Eat fruits, vegetables, and whole grains -  Drink 64 ounces of water / fluids daily.    Family History of Colon Cancer: Maternal grandmother  Family History of Colon Polyps: Mother and multiple maternal uncles  - Scheduling Colonoscopy I discussed risks of colonoscopy with patient to include risk of bleeding, colon perforation, and risk of sedation.  Patient expressed understanding and agrees to proceed with colonoscopy.  - I instructed patient to call her insurance and ask about insurance benefits when scheduling colonoscopy before age 50, and patient agrees.  Follow up as needed  based on colonoscopy results and GI symptoms.  Brigitte Canard, PA-C

## 2024-03-02 ENCOUNTER — Other Ambulatory Visit (HOSPITAL_COMMUNITY): Payer: Self-pay

## 2024-03-02 ENCOUNTER — Encounter: Payer: Self-pay | Admitting: Physician Assistant

## 2024-03-02 ENCOUNTER — Ambulatory Visit: Admitting: Physician Assistant

## 2024-03-02 VITALS — BP 100/70 | HR 76 | Ht 63.0 in | Wt 140.0 lb

## 2024-03-02 DIAGNOSIS — Z800 Family history of malignant neoplasm of digestive organs: Secondary | ICD-10-CM

## 2024-03-02 DIAGNOSIS — Z83719 Family history of colon polyps, unspecified: Secondary | ICD-10-CM

## 2024-03-02 DIAGNOSIS — K5904 Chronic idiopathic constipation: Secondary | ICD-10-CM | POA: Diagnosis not present

## 2024-03-02 MED ORDER — POLYETHYLENE GLYCOL 3350 17 GM/SCOOP PO POWD: 17.0000 g | Freq: Every day | ORAL | Status: AC

## 2024-03-02 MED ORDER — NA SULFATE-K SULFATE-MG SULF 17.5-3.13-1.6 GM/177ML PO SOLN
1.0000 | Freq: Once | ORAL | 0 refills | Status: AC
  Filled 2024-03-02: qty 354, 1d supply, fill #0

## 2024-03-02 NOTE — Patient Instructions (Addendum)
-   Continue OTC Miralax Powder; Mix 1 capful in 6 to 8 ounces of a drink once daily - Recommend high-fiber diet, 30 g of fiber daily - Eat fruits, vegetables, and whole grains - Drink 64 ounces of water / fluids daily.    You have been scheduled for a colonoscopy. Please follow written instructions given to you at your visit today.   If you use inhalers (even only as needed), please bring them with you on the day of your procedure.  DO NOT TAKE 7 DAYS PRIOR TO TEST- Trulicity (dulaglutide) Ozempic, Wegovy (semaglutide) Mounjaro (tirzepatide) Bydureon Bcise (exanatide extended release)  DO NOT TAKE 1 DAY PRIOR TO YOUR TEST Rybelsus (semaglutide) Adlyxin (lixisenatide) Victoza (liraglutide) Byetta (exanatide) ___________________________________________________________________________  _______________________________________________________  If your blood pressure at your visit was 140/90 or greater, please contact your primary care physician to follow up on this.  _______________________________________________________  If you are age 76 or older, your body mass index should be between 23-30. Your Body mass index is 24.8 kg/m. If this is out of the aforementioned range listed, please consider follow up with your Primary Care Provider.  If you are age 71 or younger, your body mass index should be between 19-25. Your Body mass index is 24.8 kg/m. If this is out of the aformentioned range listed, please consider follow up with your Primary Care Provider.   ________________________________________________________  The  GI providers would like to encourage you to use MYCHART to communicate with providers for non-urgent requests or questions.  Due to long hold times on the telephone, sending your provider a message by [facility] may be a faster and more efficient way to get a response.  Please allow 48 business hours for a response.  Please remember that this is for non-urgent  requests.  _______________________________________________________

## 2024-03-07 ENCOUNTER — Ambulatory Visit: Payer: Self-pay | Admitting: Family Medicine

## 2024-03-07 ENCOUNTER — Ambulatory Visit
Admission: RE | Admit: 2024-03-07 | Discharge: 2024-03-07 | Disposition: A | Discharge status: Home / Self Care | Source: Ambulatory Visit | Attending: Family Medicine | Admitting: Family Medicine

## 2024-03-07 ENCOUNTER — Ambulatory Visit: Payer: Self-pay

## 2024-03-07 ENCOUNTER — Encounter: Payer: Self-pay | Admitting: Family Medicine

## 2024-03-07 ENCOUNTER — Ambulatory Visit: Admitting: Family Medicine

## 2024-03-07 VITALS — BP 124/86 | Ht 63.0 in | Wt 138.0 lb

## 2024-03-07 DIAGNOSIS — M79652 Pain in left thigh: Secondary | ICD-10-CM | POA: Diagnosis not present

## 2024-03-07 DIAGNOSIS — M25552 Pain in left hip: Secondary | ICD-10-CM | POA: Diagnosis not present

## 2024-03-07 DIAGNOSIS — M7989 Other specified soft tissue disorders: Secondary | ICD-10-CM | POA: Diagnosis not present

## 2024-03-07 NOTE — Progress Notes (Unsigned)
 PCP: Barbette Knock, MD  Subjective:   HPI: Patient is a 43 y.o. female here for left thigh pain after being kicked by a horse.  [place] states that on 03/05/24 she was tending to her horse when it got excited and bucked its hind legs, inadvertently kicking her in the posterolateral left thigh. She was able to ambulate afterwards but immediately felt a significant amount of swelling and pain in the area. Bruising developed and her thigh has been stiff, but she states that her most bothersome symptom is skin burning that occurs with any touch. She has been able to ambulate the past two days. She has been taking ibuprofen 800 mg Q6h, tylenol 1000 mg Q6h, and robaxin 500 mg BID for the pain. She has had some pain radiating into the anterior knee, but states she does not have burning, numbness, electrical pain extending into the calf or foot. She does not have any weakness and has never had any trauma to the hip.  Past Medical History:  Diagnosis Date   Allergy    Anxiety    Nursing School   HLD (hyperlipidemia)    IBS (irritable bowel syndrome)    KNEE PAIN, LEFT 07/25/2009   Qualifier: Diagnosis of  By: HARVEY MD, KARL     Low back pain 06/03/2011   SPRAIN AND STRAIN OF CRUCIATE LIGAMENT OF KNEE 07/25/2009   Qualifier: Diagnosis of  By: HARVEY MD, KARL      Current Outpatient Medications on File Prior to Visit  Medication Sig Dispense Refill   5-Hydroxytryptophan (5-HTP) 50 MG CAPS Take by mouth.     B Complex Vitamins (B COMPLEX PO) Take 1 tablet by mouth daily.     Calcium Carbonate Antacid (TUMS EXTRA STRENGTH PO) Take 1-3 tablets by mouth as directed.     Fexofenadine HCl (ALLEGRA HIVES 24HR PO) Take 1 tablet by mouth daily.     Lactobacillus (PROBIOTIC ACIDOPHILUS) CAPS Take 1 capsule by mouth daily.     levonorgestrel  (KYLEENA ) 19.5 MG IUD 1 each by Intrauterine route once.     Melatonin 5 MG TABS Take by mouth.     METAMUCIL FIBER PO Take 5-6 Doses by mouth 2 (two) times daily.      Multiple Vitamin (THERA) TABS Take 1 tablet by mouth.     polyethylene glycol powder (MIRALAX) 17 GM/SCOOP powder Take 17 g by mouth daily.     No current facility-administered medications on file prior to visit.    Past Surgical History:  Procedure Laterality Date   DILATION AND CURETTAGE OF UTERUS     FRACTURE SURGERY Left 09/15/1988   elbow   WISDOM TOOTH EXTRACTION      Allergies  Allergen Reactions   Cashew Nut Oil     Other Reaction(s): Not available   Miconazole     BP 124/86   Ht 5' 3 (1.6 m)   Wt 138 lb (62.6 kg)   BMI 24.45 kg/m       No data to display              No data to display              Objective:  Physical Exam:  Gen: NAD, comfortable in exam room  MSK: Hip Inspection: Large area of bruising and swelling along superolateral aspect of thigh extending posteriorly Palpation: Tender to greater trochanter and superolateral thigh. No tenderness along anterior, inferolateral, posterior thigh.   ROM: Flexion 0 - 120, Abduction full Strength: Flexion  5/5, Abduction 5/5, Adduction 5/5  MSK Limited hip ultrasound performed, L: Date:03/07/2024 Indication: Lateral hip swelling and pain after horse kick Findings: - Evaluation of the greater tuberosity was evaluated in both short and long axis.   - There is a disorganized 6.5cm x 3cm hyperechoic region distal to the greater trochanter laterally. No hypoechoic effusions noted.  - No bony abnormalities were noted along femoral head and shaft however visualization was imparted by diffuse tissue edema   IMPRESSION:  - Disorganized hyperechoic region suggestive of tissue hematoma without any signs of a discrete fluid collection - No signs of a femoral fracture however visualization was limited by extensive tissue edema   U/S images and interpretation completed by Hosey Cedar, DO Images saved to PACS    Assessment & Plan:  Patient is a 43 y.o. female here for left thigh pain after a horse  kick on 03/05/2024. Her imaging findings and diffuse bruising are suggestive of a large tissue hematoma. Lower suspicion for a femoral fracture given she can bear weight and US  did not disclose any bony abnormalities, however since exam was limited by tissue edema we will proceed with xrays. Low suspicion for a degloving injury given no discrete pocket of fluid was visualized.   1. Lateral Left Thigh Hematoma - Xrays to rule out fracture - Thigh spica compression sleeve, to be worn at all times except when showering for next 1-2 weeks - Ibuprofen, tylenol, ice as needed for pain - Robaxin 750mg  BID - Will follow-up should xray be remarkable or symptoms persist for more than 8 weeks   Bernardino Grip, [facility]

## 2024-03-08 NOTE — Progress Notes (Signed)
 Agree with the assessment and plan as outlined by Ellouise Console, PA-C.  Will try to further clarify what 'cancerous' polyps are with patient.  If these are high risk polyps (high grade dysplasia, villous features), then she would definitely warrant early screening.

## 2024-03-14 ENCOUNTER — Other Ambulatory Visit (HOSPITAL_COMMUNITY): Payer: Self-pay

## 2024-03-14 DIAGNOSIS — G4700 Insomnia, unspecified: Secondary | ICD-10-CM | POA: Diagnosis not present

## 2024-03-14 DIAGNOSIS — F419 Anxiety disorder, unspecified: Secondary | ICD-10-CM | POA: Diagnosis not present

## 2024-03-14 DIAGNOSIS — F32A Depression, unspecified: Secondary | ICD-10-CM | POA: Diagnosis not present

## 2024-03-14 MED ORDER — SERTRALINE HCL 25 MG PO TABS
25.0000 mg | ORAL_TABLET | Freq: Every day | ORAL | 1 refills | Status: DC
  Filled 2024-03-14: qty 30, 30d supply, fill #0
  Filled 2024-04-07 (×2): qty 30, 30d supply, fill #1

## 2024-04-07 ENCOUNTER — Ambulatory Visit: Admitting: Sports Medicine

## 2024-04-07 ENCOUNTER — Other Ambulatory Visit (HOSPITAL_COMMUNITY): Payer: Self-pay

## 2024-04-07 ENCOUNTER — Other Ambulatory Visit: Payer: Self-pay

## 2024-04-11 ENCOUNTER — Encounter: Payer: Self-pay | Admitting: Gastroenterology

## 2024-04-14 ENCOUNTER — Ambulatory Visit: Admitting: Sports Medicine

## 2024-04-19 ENCOUNTER — Encounter: Payer: Self-pay | Admitting: Gastroenterology

## 2024-04-19 ENCOUNTER — Ambulatory Visit (AMBULATORY_SURGERY_CENTER): Admitting: Gastroenterology

## 2024-04-19 VITALS — BP 124/80 | HR 65 | Temp 98.4°F | Resp 12 | Ht 63.0 in | Wt 140.0 lb

## 2024-04-19 DIAGNOSIS — K5904 Chronic idiopathic constipation: Secondary | ICD-10-CM

## 2024-04-19 DIAGNOSIS — Z1211 Encounter for screening for malignant neoplasm of colon: Secondary | ICD-10-CM | POA: Diagnosis not present

## 2024-04-19 DIAGNOSIS — Z800 Family history of malignant neoplasm of digestive organs: Secondary | ICD-10-CM | POA: Diagnosis not present

## 2024-04-19 DIAGNOSIS — E785 Hyperlipidemia, unspecified: Secondary | ICD-10-CM | POA: Diagnosis not present

## 2024-04-19 DIAGNOSIS — F419 Anxiety disorder, unspecified: Secondary | ICD-10-CM | POA: Diagnosis not present

## 2024-04-19 MED ORDER — SODIUM CHLORIDE 0.9 % IV SOLN: 500.0000 mL | INTRAVENOUS | Status: DC

## 2024-04-19 NOTE — Op Note (Signed)
 [facility] Patient Name: Regina Fernandez Procedure Date: 04/19/2024 2:18 PM MRN: [id number] Endoscopist: Glendia E. Stacia , MD, [id number] Age: 43 Referring MD:  Date of Birth: 01-16-1981 Gender: Female Account #: [id number] Procedure:                Colonoscopy Indications:              Screening for colon cancer: Family history of                            colorectal cancer in multiple 2nd degree relatives                            (mother, maternal uncle); mother with precancerous                            polyps Medicines:                Monitored Anesthesia Care Procedure:                Pre-Anesthesia Assessment:                           - Prior to the procedure, a History and Physical                            was performed, and patient medications and                            allergies were reviewed. The patient's tolerance of                            previous anesthesia was also reviewed. The risks                            and benefits of the procedure and the sedation                            options and risks were discussed with the patient.                            All questions were answered, and informed consent                            was obtained. Prior Anticoagulants: The patient has                            taken no anticoagulant or antiplatelet agents. ASA                            Grade Assessment: II - A patient with mild systemic                            disease. After reviewing the risks and benefits,  the patient was deemed in satisfactory condition to                            undergo the procedure.                           After obtaining informed consent, the colonoscope                            was passed under direct vision. Throughout the                            procedure, the patient's blood pressure, pulse, and                            oxygen saturations were monitored  continuously. The                            CF [id number] #[id number] was introduced through the anus                            and advanced to the the terminal ileum, with                            identification of the appendiceal orifice and IC                            valve. The colonoscopy was performed without                            difficulty. The patient tolerated the procedure                            well. The quality of the bowel preparation was                            excellent. The terminal ileum, ileocecal valve,                            appendiceal orifice, and rectum were photographed.                            The bowel preparation used was SUPREP via split                            dose instruction. Scope In: 2:27:10 PM Scope Out: 2:40:19 PM Scope Withdrawal Time: 0 hours 9 minutes 1 second  Total Procedure Duration: 0 hours 13 minutes 9 seconds  Findings:                 The perianal and digital rectal examinations were                            normal. Pertinent negatives include normal  sphincter tone and no palpable rectal lesions.                           The colon (entire examined portion) appeared normal.                           The terminal ileum appeared normal.                           The retroflexed view of the distal rectum and anal                            verge was normal and showed no anal or rectal                            abnormalities. Complications:            No immediate complications. Estimated Blood Loss:     Estimated blood loss: none. Impression:               - The entire examined colon is normal.                           - The examined portion of the ileum was normal.                           - The distal rectum and anal verge are normal on                            retroflexion view.                           - No specimens collected. Recommendation:           - Patient has a contact number  available for                            emergencies. The signs and symptoms of potential                            delayed complications were discussed with the                            patient. Return to normal activities tomorrow.                            Written discharge instructions were provided to the                            patient.                           - Resume previous diet.                           - Continue present medications.                           -  Repeat colonoscopy in 10 years for screening                            purposes. [place] Natarajan E. Stacia, MD 04/19/2024 2:45:45 PM This report has been signed electronically.

## 2024-04-19 NOTE — Progress Notes (Signed)
 Sedate, gd SR, tolerated procedure well, VSS, report to RN

## 2024-04-19 NOTE — Patient Instructions (Signed)

## 2024-04-19 NOTE — Progress Notes (Signed)
 [facility] History and Physical   Primary Care Physician:  Barbette Knock, MD   Reason for Procedure:   Colon cancer screening, high risk  Plan:    Colonoscopy     HPI: Regina Fernandez is a 43 y.o. female undergoing initial screening colonoscopy.  She has chronic constipation but no other chronic GI symptoms.   Past Medical History:  Diagnosis Date   Allergy    Anxiety    Nursing School   HLD (hyperlipidemia)    IBS (irritable bowel syndrome)    KNEE PAIN, LEFT 07/25/2009   Qualifier: Diagnosis of  By: HARVEY MD, KARL     Low back pain 06/03/2011   SPRAIN AND STRAIN OF CRUCIATE LIGAMENT OF KNEE 07/25/2009   Qualifier: Diagnosis of  By: HARVEY MD, KARL      Past Surgical History:  Procedure Laterality Date   DILATION AND CURETTAGE OF UTERUS     FRACTURE SURGERY Left 09/15/1988   elbow   WISDOM TOOTH EXTRACTION      Prior to Admission medications   Medication Sig Start Date End Date Taking? Authorizing Provider  5-Hydroxytryptophan (5-HTP) 50 MG CAPS Take by mouth.   Yes [provider]  B Complex Vitamins (B COMPLEX PO) Take 1 tablet by mouth daily.   Yes [provider]  Calcium Carbonate Antacid (TUMS EXTRA STRENGTH PO) Take 1-3 tablets by mouth as directed.   Yes [provider]  Fexofenadine HCl (ALLEGRA HIVES 24HR PO) Take 1 tablet by mouth daily.   Yes [provider]  Lactobacillus (PROBIOTIC ACIDOPHILUS) CAPS Take 1 capsule by mouth daily.   Yes [provider]  levonorgestrel  (KYLEENA ) 19.5 MG IUD 1 each by Intrauterine route once. 07/01/22  Yes [provider]  Melatonin 5 MG TABS Take by mouth.   Yes [provider]  Multiple Vitamin (THERA) TABS Take 1 tablet by mouth.   Yes [provider]  polyethylene glycol powder (MIRALAX ) 17 GM/SCOOP powder Take 17 g by mouth daily. 03/02/24  Yes [place], PA-C  sertraline  (ZOLOFT ) 25 MG tablet Take 1 tablet (25 mg total) by  mouth daily. 03/14/24  Yes   METAMUCIL FIBER PO Take 5-6 Doses by mouth 2 (two) times daily. Patient not taking: Reported on 04/19/2024 09/15/20   [provider]    Current Outpatient Medications  Medication Sig Dispense Refill   5-Hydroxytryptophan (5-HTP) 50 MG CAPS Take by mouth.     B Complex Vitamins (B COMPLEX PO) Take 1 tablet by mouth daily.     Calcium Carbonate Antacid (TUMS EXTRA STRENGTH PO) Take 1-3 tablets by mouth as directed.     Fexofenadine HCl (ALLEGRA HIVES 24HR PO) Take 1 tablet by mouth daily.     Lactobacillus (PROBIOTIC ACIDOPHILUS) CAPS Take 1 capsule by mouth daily.     levonorgestrel  (KYLEENA ) 19.5 MG IUD 1 each by Intrauterine route once.     Melatonin 5 MG TABS Take by mouth.     Multiple Vitamin (THERA) TABS Take 1 tablet by mouth.     polyethylene glycol powder (MIRALAX ) 17 GM/SCOOP powder Take 17 g by mouth daily.     sertraline  (ZOLOFT ) 25 MG tablet Take 1 tablet (25 mg total) by mouth daily. 30 tablet 1   METAMUCIL FIBER PO Take 5-6 Doses by mouth 2 (two) times daily. (Patient not taking: Reported on 04/19/2024)     Current Facility-Administered Medications  Medication Dose Route Frequency Provider Last Rate Last Admin   0.9 %  sodium chloride  infusion  500 mL Intravenous Continuous Stacia Glendia BRAVO, MD        Allergies as of 04/19/2024 - Review Complete 04/19/2024  Allergen Reaction Noted   Cashew nut oil Hives 03/02/2024   Miconazole Hives 06/03/2011    Family History  Problem Relation Age of Onset   Colon polyps Mother    Hyperlipidemia Father    Colon cancer Maternal Grandmother    Diabetes Maternal Grandfather    Heart disease Maternal Grandfather    Diabetes Paternal Grandmother    Kidney disease Paternal Grandmother    Colon polyps Maternal Uncle    Heart disease Maternal Uncle    Graves' disease Maternal Uncle    Colon polyps Maternal Uncle    Sudden death Neg Hx    Hypertension Neg Hx    Heart attack Neg Hx     Social  History   Socioeconomic History   Marital status: Married    Spouse name: boyfriend-TD   Number of children: 0   Years of education: Not on file   Highest education level: Not on file  Occupational History   Occupation: RN    Comment: General Medicine at [employer]  Tobacco Use   Smoking status: Former    Types: Cigarettes    Start date: 2008   Smokeless tobacco: Never   Tobacco comments:    Social at bars in 20's  Vaping Use   Vaping status: Never Used  Substance and Sexual Activity   Alcohol use: Yes    Comment: 0-1 per day   Drug use: No   Sexual activity: Yes    Partners: Male  Other Topics Concern   Not on file  Social History Narrative   Lives with her parents. Completed nursing school in 2014.   Social Drivers of Corporate investment banker Strain: Low Risk  (02/02/2021)   Received from [facility]   Overall Financial Resource Strain (CARDIA)    Difficulty of Paying Living Expenses: Not very hard  Food Insecurity: No Food Insecurity (02/01/2021)   Received from [facility]   Hunger Vital Sign    Within the past 12 months, you worried that your food would run out before you got the money to buy more.: Never true    Within the past 12 months, the food you bought just didn't last and you didn't have money to get more.: Never true  Transportation Needs: No Transportation Needs (02/02/2021)   Received from [facility] - Transportation    Lack of Transportation (Medical): No    Lack of Transportation (Non-Medical): No  Physical Activity: Insufficiently Active (02/02/2021)   Received from [facility]   Exercise Vital Sign    On average, how many days per week do you engage in moderate to strenuous exercise (like a brisk walk)?: 3 days    On average, how many minutes do you engage in exercise at this level?: 20 min  Stress: No Stress Concern Present (02/02/2021)   Received from [facility] - Occupational Stress  Questionnaire    Feeling of Stress : Not at all  Social Connections: Unknown (01/18/2022)   Received from [facility]   Social Network    Social Network: Not on file  Intimate Partner Violence: Unknown (12/16/2021)   Received from [facility]   HITS    Physically Hurt: Not on file    Insult or Talk Down To: Not on file  Threaten Physical Harm: Not on file    Scream or Curse: Not on file    Review of Systems:  All other review of systems negative except as mentioned in the HPI.  Physical Exam: Vital signs BP 134/79   Pulse 77   Temp 98.4 F (36.9 C)   Ht 5' 3 (1.6 m)   Wt 140 lb (63.5 kg)   SpO2 98%   BMI 24.80 kg/m   General:   Alert,  Well-developed, well-nourished, pleasant and cooperative in NAD Airway:  Mallampati 2 Lungs:  Clear throughout to auscultation.   Heart:  Regular rate and rhythm; no murmurs, clicks, rubs,  or gallops. Abdomen:  Soft, nontender and nondistended. Normal bowel sounds.   Neuro/Psych:  Normal mood and affect. A and O x 3   Mikai Meints E. Stacia, MD [facility]

## 2024-04-20 ENCOUNTER — Telehealth: Payer: Self-pay

## 2024-04-20 NOTE — Telephone Encounter (Signed)
 No answer, left message to call if having any issues or concerns, B.Vale Mousseau RN

## 2024-04-26 ENCOUNTER — Other Ambulatory Visit (HOSPITAL_COMMUNITY): Payer: Self-pay

## 2024-04-26 DIAGNOSIS — F419 Anxiety disorder, unspecified: Secondary | ICD-10-CM | POA: Diagnosis not present

## 2024-04-26 DIAGNOSIS — F32A Depression, unspecified: Secondary | ICD-10-CM | POA: Diagnosis not present

## 2024-04-26 MED ORDER — SERTRALINE HCL 25 MG PO TABS
25.0000 mg | ORAL_TABLET | Freq: Every day | ORAL | 2 refills | Status: AC
  Filled 2024-05-12: qty 90, 90d supply, fill #0
  Filled 2024-08-08: qty 90, 90d supply, fill #1

## 2024-04-27 ENCOUNTER — Other Ambulatory Visit (HOSPITAL_COMMUNITY): Payer: Self-pay

## 2024-05-12 ENCOUNTER — Other Ambulatory Visit: Payer: Self-pay

## 2024-05-12 ENCOUNTER — Other Ambulatory Visit (HOSPITAL_COMMUNITY): Payer: Self-pay

## 2024-05-13 ENCOUNTER — Other Ambulatory Visit (HOSPITAL_COMMUNITY): Payer: Self-pay

## 2024-09-16 ENCOUNTER — Other Ambulatory Visit (HOSPITAL_COMMUNITY): Payer: Self-pay

## 2024-09-29 ENCOUNTER — Ambulatory Visit: Admitting: Family Medicine

## 2024-10-17 ENCOUNTER — Ambulatory Visit: Admitting: Family Medicine

## 2024-10-17 ENCOUNTER — Ambulatory Visit: Payer: Self-pay

## 2024-10-17 NOTE — Telephone Encounter (Signed)
 FYI Only or Action Required?: FYI only for provider: ED or mobile medicine unit advised.  Patient was last seen in primary care on unknown.  Called Nurse Triage reporting Back Pain.  Symptoms began a year ago.  Interventions attempted: Prescription medications: Robaxin and Other: tens unit, stretching.  Symptoms are: gradually worsening.  Triage Disposition: See Physician Within 24 Hours (overriding See HCP Within 4 Hours (Or PCP Triage))  Patient/caregiver understands and will follow disposition?: Yes                                  1. ONSET: When did the pain begin? (e.g., minutes, hours, days)     Intermittent over the past year, worsened within past 1-2 weeks 2. LOCATION: Where does it hurt? (upper, mid or lower back)     Lower 3. SEVERITY: How bad is the pain?  (e.g., Scale 1-10; mild, moderate, or severe)     Rates pain 6-7 in the mornings, lessens throughout the day 5. RADIATION: Does the pain shoot into your legs or somewhere else?     Radiating down back of bilateral legs  8. MEDICINES: What have you taken so far for the pain? (e.g., nothing, acetaminophen, NSAIDS)     Robaxin, tens unit, stretching  9. NEUROLOGIC SYMPTOMS: Do you have any weakness, numbness, or problems with bowel/bladder control?     Denies: issues using restroom, numbness/tingling 10. OTHER SYMPTOMS: Do you have any other symptoms? (e.g., fever, abdomen pain, burning with urination, blood in urine)     Denies: abdominal pain  Patient called in seeking advice on where she can go to receive imaging for the back pain she is experiencing. Patient is not established with an Engineer, Structural. Patient has already called [facility] and her sports medicine doctor, both of which are closed today. This RN advised that all [facility] practices are closed as well today. This RN advised that patient could go to ED, if she wanted imaging done today. Otherwise, this RN advised that  the mobile medicine unit is scheduled to operate starting tomorrow at 10 AM. Patient complied and stated she would most likely go to mobile medicine unit tomorrow.   Reason for Disposition  [1] Pain radiates into the thigh or further down the leg AND [2] both legs  Protocols used: Back Pain-A-AH  Reason for Triage: severe lower back pain; spreading down to her legs

## 2024-10-18 ENCOUNTER — Ambulatory Visit

## 2024-10-18 ENCOUNTER — Ambulatory Visit: Admission: RE | Admit: 2024-10-18 | Discharge: 2024-10-18 | Disposition: A | Source: Ambulatory Visit

## 2024-10-18 VITALS — BP 126/84 | Ht 63.0 in | Wt 140.0 lb

## 2024-10-18 DIAGNOSIS — G8929 Other chronic pain: Secondary | ICD-10-CM

## 2024-10-18 DIAGNOSIS — M545 Low back pain, unspecified: Secondary | ICD-10-CM | POA: Diagnosis not present

## 2024-10-18 MED ORDER — KETOROLAC TROMETHAMINE 60 MG/2ML IM SOLN
30.0000 mg | Freq: Once | INTRAMUSCULAR | Status: AC
  Administered 2024-10-18: 30 mg via INTRAMUSCULAR

## 2024-10-18 MED ORDER — METHYLPREDNISOLONE ACETATE 40 MG/ML IJ SUSP
80.0000 mg | Freq: Once | INTRAMUSCULAR | Status: AC
  Administered 2024-10-18: 80 mg via INTRAMUSCULAR

## 2024-10-18 NOTE — Progress Notes (Signed)
 " PCP: Barbette Knock, MD  Subjective:   HPI: Patient is a 44 y.o. female here for bilateral low back/sacral pain.  Patient states that ever since they moved in April 2025, she has had some persistent and constant pain of her bilateral low back/sacrum.  In the past 2 weeks she feels like it is worsening.  She denies any tingling or numbness but feels like she has a muscle strain going from her posterior sacral region down the bilateral posterior portion of her legs.  She denies any known injury.  She has tried Robaxin, Tylenol, ibuprofen, gabapentin, heating pad, TENS unit and still is having persistent pain.  She has been trying this for greater than 6 weeks.  She does not denies any history of fracture, surgery or low back.  She has not had any therapy for this.  She denies any lower extremity weakness, groin numbness, loss of bowel or bladder function.  Past Medical History:  Diagnosis Date   Allergy    Anxiety    Nursing School   HLD (hyperlipidemia)    IBS (irritable bowel syndrome)    KNEE PAIN, LEFT 07/25/2009   Qualifier: Diagnosis of  By: HARVEY MD, KARL     Low back pain 06/03/2011   SPRAIN AND STRAIN OF CRUCIATE LIGAMENT OF KNEE 07/25/2009   Qualifier: Diagnosis of  By: HARVEY MD, KARL      Medications Ordered Prior to Encounter[1]  BP 126/84   Ht 5' 3 (1.6 m)   Wt 140 lb (63.5 kg)   BMI 24.80 kg/m        Objective:   Physical Exam:  Gen: NAD, comfortable in exam room Lumbar spine/sacrum Palpation: Patient has bilateral tenderness to palpation in the area of the SI joint/upper gluteal region ROM: Patient has great range of motion with flexion and extension of her low back, bilateral hip flexion, internal and external rotation are normal and pain-free Special Tests: Negative seated and lying straight leg test, negative FABER, negative FADIR, negative distraction, negative compression, negative thigh thrust Neuro: Bilateral lower extremity sensation is equal and  intact, reflexes are 2+, hip flexion, knee extension, plantarflexion and dorsiflexion are 5/5 and equal  Assessment/Plan:   Regina Fernandez is a 44 y.o. female who was seen today for the following: 1. Chronic bilateral low back pain, unspecified whether sciatica present (Primary) - DG Lumbar Spine 2-3 Views; Future - ketorolac  (TORADOL ) injection 30 mg - methylPREDNISolone  acetate (DEPO-MEDROL ) injection 80 mg - Patient has had chronic and worsening low back pain for 10 months - Physical exam is negative for radicular symptoms - She has attempted multiple treatment modalities - Today we will pursue x-ray of the lumbar spine - She has a personal contact who will help her with PT for the low back - Given Toradol  30 mg plus Depo-Medrol  80 mg today - Will call with x-ray results - Will follow-up in 2 weeks  Follow-up/Education:   No follow-ups on file.   May return sooner as needed and encouraged to call/e-mail for additional questions or  worsening symptoms in the interim.  Krystal Lowing, DO Sports Medicine Fellow 10/18/2024 12:43 PM    [1]  Current Outpatient Medications on File Prior to Visit  Medication Sig Dispense Refill   5-Hydroxytryptophan (5-HTP) 50 MG CAPS Take by mouth.     B Complex Vitamins (B COMPLEX PO) Take 1 tablet by mouth daily.     Calcium Carbonate Antacid (TUMS EXTRA STRENGTH PO) Take 1-3 tablets by mouth as  directed.     Fexofenadine HCl (ALLEGRA HIVES 24HR PO) Take 1 tablet by mouth daily.     Lactobacillus (PROBIOTIC ACIDOPHILUS) CAPS Take 1 capsule by mouth daily.     levonorgestrel  (KYLEENA ) 19.5 MG IUD 1 each by Intrauterine route once.     Melatonin 5 MG TABS Take by mouth.     METAMUCIL FIBER PO Take 5-6 Doses by mouth 2 (two) times daily. (Patient not taking: Reported on 04/19/2024)     Multiple Vitamin (THERA) TABS Take 1 tablet by mouth.     polyethylene glycol powder (MIRALAX ) 17 GM/SCOOP powder Take 17 g by mouth daily.     sertraline  (ZOLOFT )  25 MG tablet Take 1 tablet (25 mg total) by mouth daily. 90 tablet 2   No current facility-administered medications on file prior to visit.   "

## 2024-10-20 ENCOUNTER — Ambulatory Visit: Admitting: Family Medicine

## 2024-10-21 ENCOUNTER — Other Ambulatory Visit (HOSPITAL_COMMUNITY): Payer: Self-pay

## 2024-10-21 DIAGNOSIS — M5416 Radiculopathy, lumbar region: Secondary | ICD-10-CM

## 2024-10-21 MED ORDER — METHYLPREDNISOLONE 4 MG PO TBPK
ORAL_TABLET | ORAL | 0 refills | Status: AC
  Filled 2024-10-21: qty 21, 6d supply, fill #0

## 2024-10-22 ENCOUNTER — Other Ambulatory Visit

## 2024-11-01 ENCOUNTER — Ambulatory Visit
